# Patient Record
Sex: Male | Born: 1965 | Race: Black or African American | Hispanic: No | Marital: Single | Smoking: Never smoker
Health system: Southern US, Community
[De-identification: ages and names within clinical notes are randomized; demographics above are authoritative.]

## PROBLEM LIST (undated history)

## (undated) DIAGNOSIS — F32A Depression, unspecified: Secondary | ICD-10-CM

## (undated) DIAGNOSIS — E119 Type 2 diabetes mellitus without complications: Secondary | ICD-10-CM

## (undated) DIAGNOSIS — F319 Bipolar disorder, unspecified: Secondary | ICD-10-CM

## (undated) DIAGNOSIS — I10 Essential (primary) hypertension: Secondary | ICD-10-CM

## (undated) DIAGNOSIS — M549 Dorsalgia, unspecified: Secondary | ICD-10-CM

## (undated) DIAGNOSIS — M1990 Unspecified osteoarthritis, unspecified site: Secondary | ICD-10-CM

## (undated) DIAGNOSIS — K649 Unspecified hemorrhoids: Secondary | ICD-10-CM

## (undated) DIAGNOSIS — F329 Major depressive disorder, single episode, unspecified: Secondary | ICD-10-CM

## (undated) DIAGNOSIS — L100 Pemphigus vulgaris: Secondary | ICD-10-CM

## (undated) HISTORY — DX: Unspecified osteoarthritis, unspecified site: M19.90

## (undated) HISTORY — DX: Unspecified hemorrhoids: K64.9

---

## 2009-08-14 ENCOUNTER — Encounter: Admission: RE | Admit: 2009-08-14 | Discharge: 2009-08-14 | Payer: Self-pay | Admitting: Occupational Medicine

## 2009-08-14 IMAGING — CR DG CHEST 1V
1 series · 1 of 1 positions shown · non-contrast
Comparison: None.

CLINICAL DATA: Pre-employment physical.

CHEST - 1 VIEW

[view not recorded]
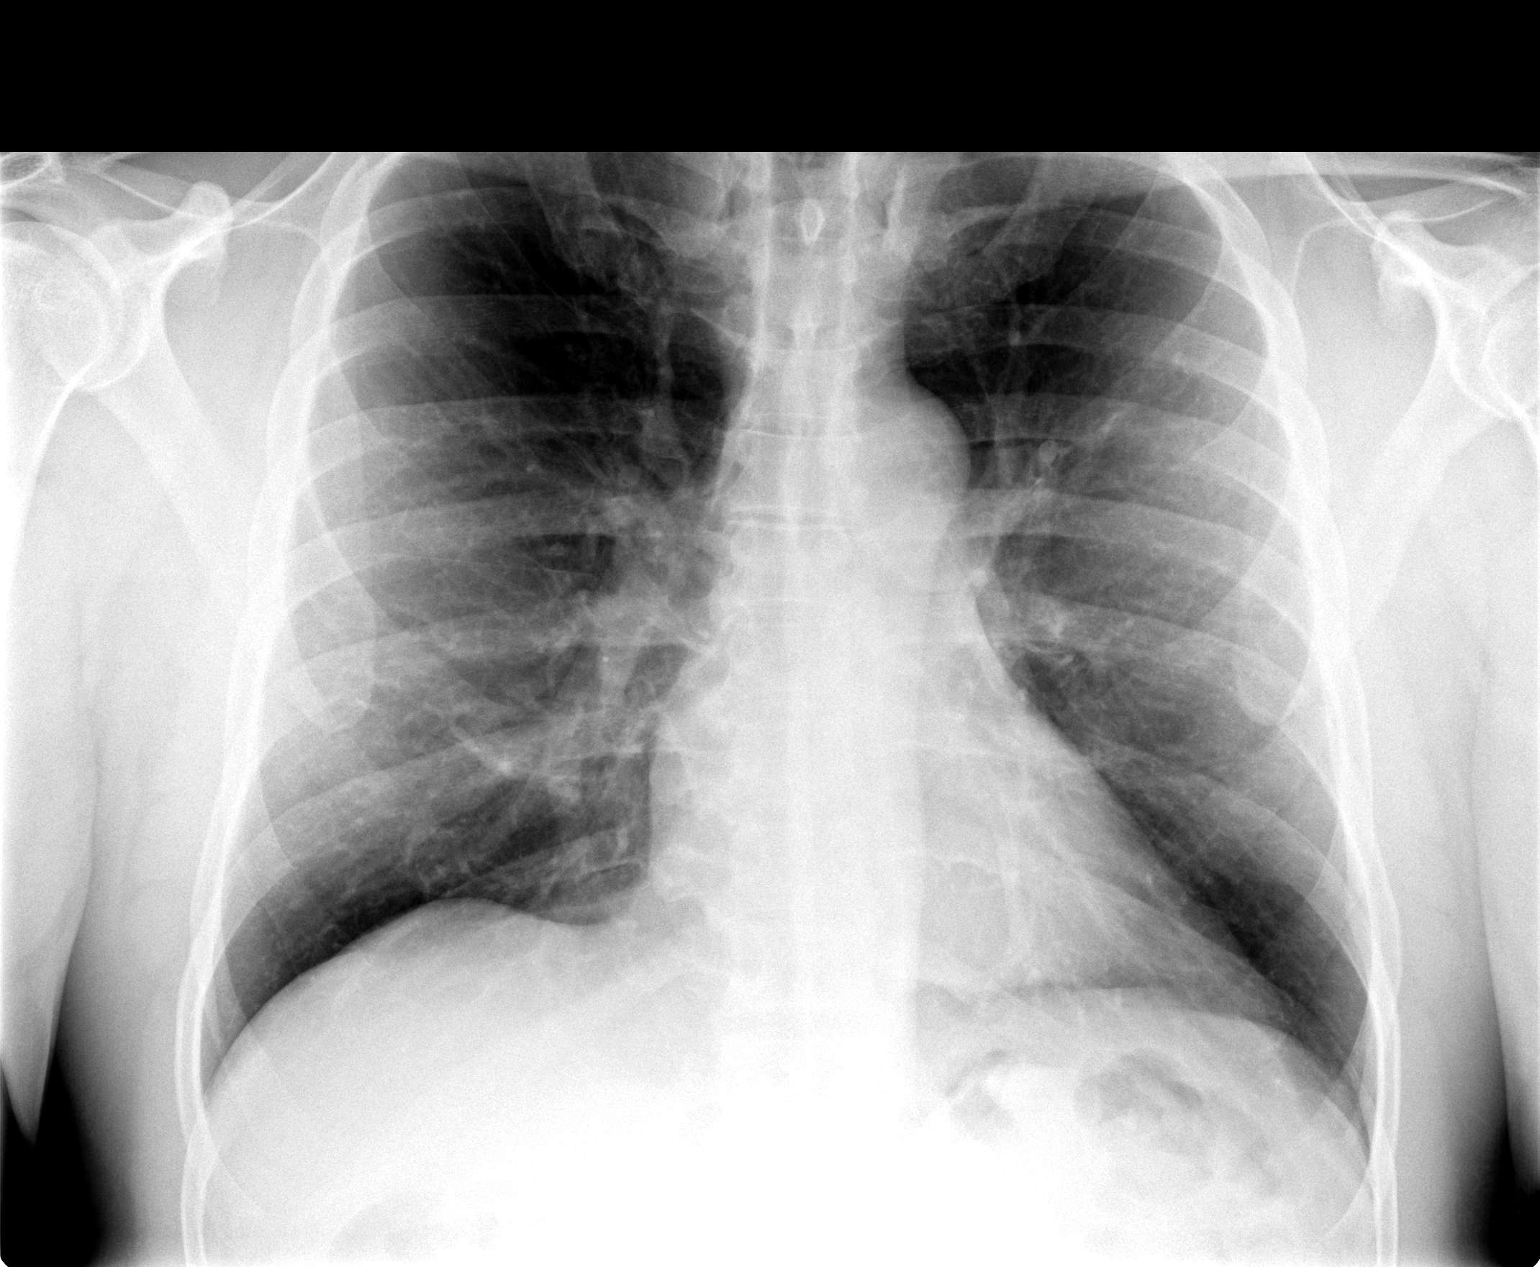

[1 of 1 positions shown; findings below may reference images not displayed]

FINDINGS: The cardiopericardial silhouette is normal in size.  The
lungs are clear.  The visualized soft tissues and bony thorax are
unremarkable.
IMPRESSION: Negative single view chest

## 2009-11-18 ENCOUNTER — Emergency Department (HOSPITAL_COMMUNITY): Admission: EM | Admit: 2009-11-18 | Discharge: 2009-11-18 | Payer: Self-pay | Admitting: Emergency Medicine

## 2010-03-07 ENCOUNTER — Emergency Department (HOSPITAL_COMMUNITY)
Admission: EM | Admit: 2010-03-07 | Discharge: 2010-03-07 | Disposition: A | Discharge status: Home / Self Care | Payer: Self-pay | Attending: Emergency Medicine | Admitting: Emergency Medicine

## 2010-03-07 DIAGNOSIS — J3489 Other specified disorders of nose and nasal sinuses: Secondary | ICD-10-CM | POA: Insufficient documentation

## 2010-03-07 DIAGNOSIS — I10 Essential (primary) hypertension: Secondary | ICD-10-CM | POA: Insufficient documentation

## 2010-03-07 DIAGNOSIS — L0291 Cutaneous abscess, unspecified: Secondary | ICD-10-CM | POA: Insufficient documentation

## 2010-03-07 DIAGNOSIS — B353 Tinea pedis: Secondary | ICD-10-CM | POA: Insufficient documentation

## 2010-03-07 DIAGNOSIS — L0390 Cellulitis, unspecified: Secondary | ICD-10-CM | POA: Insufficient documentation

## 2011-01-15 ENCOUNTER — Emergency Department (HOSPITAL_COMMUNITY)
Admission: EM | Admit: 2011-01-15 | Discharge: 2011-01-15 | Disposition: A | Discharge status: Home / Self Care | Payer: Self-pay | Attending: Emergency Medicine | Admitting: Emergency Medicine

## 2011-01-15 ENCOUNTER — Encounter: Payer: Self-pay | Admitting: Emergency Medicine

## 2011-01-15 DIAGNOSIS — R51 Headache: Secondary | ICD-10-CM | POA: Insufficient documentation

## 2011-01-15 DIAGNOSIS — R040 Epistaxis: Secondary | ICD-10-CM | POA: Insufficient documentation

## 2011-01-15 DIAGNOSIS — I10 Essential (primary) hypertension: Secondary | ICD-10-CM | POA: Insufficient documentation

## 2011-01-15 HISTORY — DX: Essential (primary) hypertension: I10

## 2011-01-15 NOTE — ED Notes (Signed)
Correction -pt not using saline as directed. Using afrin x 1 month

## 2011-01-15 NOTE — ED Provider Notes (Signed)
History     CSN: [id number] Arrival date & time: 01/15/2011 11:01 AM   First MD Initiated Contact with Patient 01/15/11 1104      Chief Complaint  Patient presents with  . Facial Pain    sinus preassure and headache x 2 months    (Consider location/radiation/quality/duration/timing/severity/associated sxs/prior treatment) HPI Comments: Patient presents emergency department for "dry sinuses".  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  Patient denies any fevers, night sweats, chills, rhinorrhea congestion, sinus pressure, headaches.  Patient has no other complaints.  The history is provided by the patient.    Past Medical History  Diagnosis Date  . Hypertension     History reviewed. No pertinent past surgical history.  History reviewed. No pertinent family history.  History  Substance Use Topics  . Smoking status: Never Smoker   . Smokeless tobacco: Not on file  . Alcohol Use: No      Review of Systems  Constitutional: Negative for fever, chills and fatigue.  HENT: Positive for sore throat and postnasal drip. Negative for ear pain, congestion, rhinorrhea, sneezing, trouble swallowing, neck pain, neck stiffness, sinus pressure and ear discharge.   Eyes: Negative for visual disturbance.  Respiratory: Negative for cough, chest tightness, shortness of breath and wheezing.   Cardiovascular: Negative for chest pain and palpitations.  Gastrointestinal: Negative for nausea, vomiting, diarrhea, constipation and blood in stool.  Skin: Negative for rash.  Neurological: Negative for dizziness, weakness, light-headedness, numbness and headaches.  All other systems reviewed and are negative.    Allergies  Lactose intolerance (gi) and Penicillins  Home Medications   Current Outpatient Rx  Name Route Sig Dispense Refill  . ASPIRIN EC 81  MG PO TBEC Oral Take 81 mg by mouth daily.      Marland Kitchen FLUTICASONE PROPIONATE 50 MCG/ACT NA SUSP Nasal Place 2 sprays into the nose 2 (two) times daily as needed.      Marland Kitchen LISINOPRIL 10 MG PO TABS Oral Take 10 mg by mouth daily.      Carma Leaven M PLUS PO TABS Oral Take 1 tablet by mouth daily.        BP 131/82  Pulse 80  Temp 98.1 F (36.7 C)  Resp 20  SpO2 100%  Physical Exam  Nursing note and vitals reviewed. Constitutional: He is oriented to person, place, and time. He appears well-developed and well-nourished. No distress.  HENT:  Head: Normocephalic and atraumatic. No trismus in the jaw.  Right Ear: Tympanic membrane, external ear and ear canal normal.  Left Ear: Tympanic membrane, external ear and ear canal normal.  Nose: Nose normal. No rhinorrhea. Right sinus exhibits no maxillary sinus tenderness and no frontal sinus tenderness. Left sinus exhibits no maxillary sinus tenderness and no frontal sinus tenderness.  Mouth/Throat: Uvula is midline and mucous membranes are normal. Normal dentition. No dental abscesses or uvula swelling. No oropharyngeal exudate, posterior oropharyngeal edema, posterior oropharyngeal erythema or tonsillar abscesses.  Neck: Trachea normal, normal range of motion and full passive range of motion without pain. Neck supple. No rigidity. Normal range of motion present. No Brudzinski's sign noted.  Cardiovascular: Normal rate and regular rhythm.   Pulmonary/Chest: Effort normal and breath sounds normal.  Abdominal: Soft.  Musculoskeletal: Normal range of motion.  Lymphadenopathy:       Head (right side): No preauricular and no posterior  auricular adenopathy present.       Head (left side): No preauricular and no posterior auricular adenopathy present.    He has no cervical adenopathy.  Neurological: He is alert and oriented to person, place, and time.  Skin: Skin is warm and dry. He is not diaphoretic.  Psychiatric: He has a normal mood and affect.    ED Course    Procedures (including critical care time)  Labs Reviewed - No data to display No results found.   No diagnosis found.  Instructed patient to flush with saline to hydrate his sinuses.  He is instructed to avoid the use of product slight Afrin that can be drying to the sinuses.  Patient has been referred to an ENT for further followup.  MDM  Epistaxis         Jaci Carrel, [place] 01/15/11 1155

## 2011-01-15 NOTE — ED Notes (Signed)
No complaints at present. Voices understanding of instructions given. Walked to check out window.  

## 2011-01-15 NOTE — ED Notes (Deleted)
Pt followed up with occupation health for c/o dry sinus. Using saline spray

## 2011-01-16 NOTE — ED Provider Notes (Signed)
Medical screening examination/treatment/procedure(s) were performed by non-physician practitioner and as supervising physician I was immediately available for consultation/collaboration.   Duanne Duchesne A Darrell Leonhardt, MD 01/16/11 1415 

## 2011-09-23 ENCOUNTER — Emergency Department (HOSPITAL_COMMUNITY)
Admission: EM | Admit: 2011-09-23 | Discharge: 2011-09-23 | Disposition: A | Discharge status: Home / Self Care | Payer: Self-pay | Attending: Emergency Medicine | Admitting: Emergency Medicine

## 2011-09-23 ENCOUNTER — Emergency Department (HOSPITAL_COMMUNITY): Payer: Self-pay

## 2011-09-23 ENCOUNTER — Encounter (HOSPITAL_COMMUNITY): Payer: Self-pay | Admitting: *Deleted

## 2011-09-23 DIAGNOSIS — I10 Essential (primary) hypertension: Secondary | ICD-10-CM | POA: Insufficient documentation

## 2011-09-23 DIAGNOSIS — J329 Chronic sinusitis, unspecified: Secondary | ICD-10-CM

## 2011-09-23 DIAGNOSIS — Z7982 Long term (current) use of aspirin: Secondary | ICD-10-CM | POA: Insufficient documentation

## 2011-09-23 DIAGNOSIS — R079 Chest pain, unspecified: Secondary | ICD-10-CM | POA: Insufficient documentation

## 2011-09-23 LAB — POCT I-STAT TROPONIN I: Troponin i, poc: 0 ng/mL (ref 0.00–0.08)

## 2011-09-23 LAB — BASIC METABOLIC PANEL
BUN: 18 mg/dL (ref 6–23)
GFR calc non Af Amer: 71 mL/min — ABNORMAL LOW (ref >90)
Glucose, Bld: 105 mg/dL — ABNORMAL HIGH (ref 70–99)
Potassium: 3.8 mEq/L (ref 3.5–5.1)
Sodium: 136 mEq/L (ref 135–145)

## 2011-09-23 LAB — CBC WITH DIFFERENTIAL/PLATELET
Basophils Absolute: 0 10*3/uL (ref 0.0–0.1)
Lymphocytes Relative: 30 % (ref 12–46)
Lymphs Abs: 1.2 10*3/uL (ref 0.7–4.0)
MCH: 27.9 pg (ref 26.0–34.0)
MCV: 82.8 fL (ref 78.0–100.0)
Neutro Abs: 2.4 10*3/uL (ref 1.7–7.7)
Neutrophils Relative %: 58 % (ref 43–77)
RBC: 5.12 MIL/uL (ref 4.22–5.81)
RDW: 14 % (ref 11.5–15.5)

## 2011-09-23 IMAGING — CR DG CHEST 2V
2 series · 2 of 2 positions shown · non-contrast
Comparison: Portable examination [DATE].

CLINICAL DATA: Chest pressure.  Hypertension.

CHEST - 2 VIEW

[w chest pa]
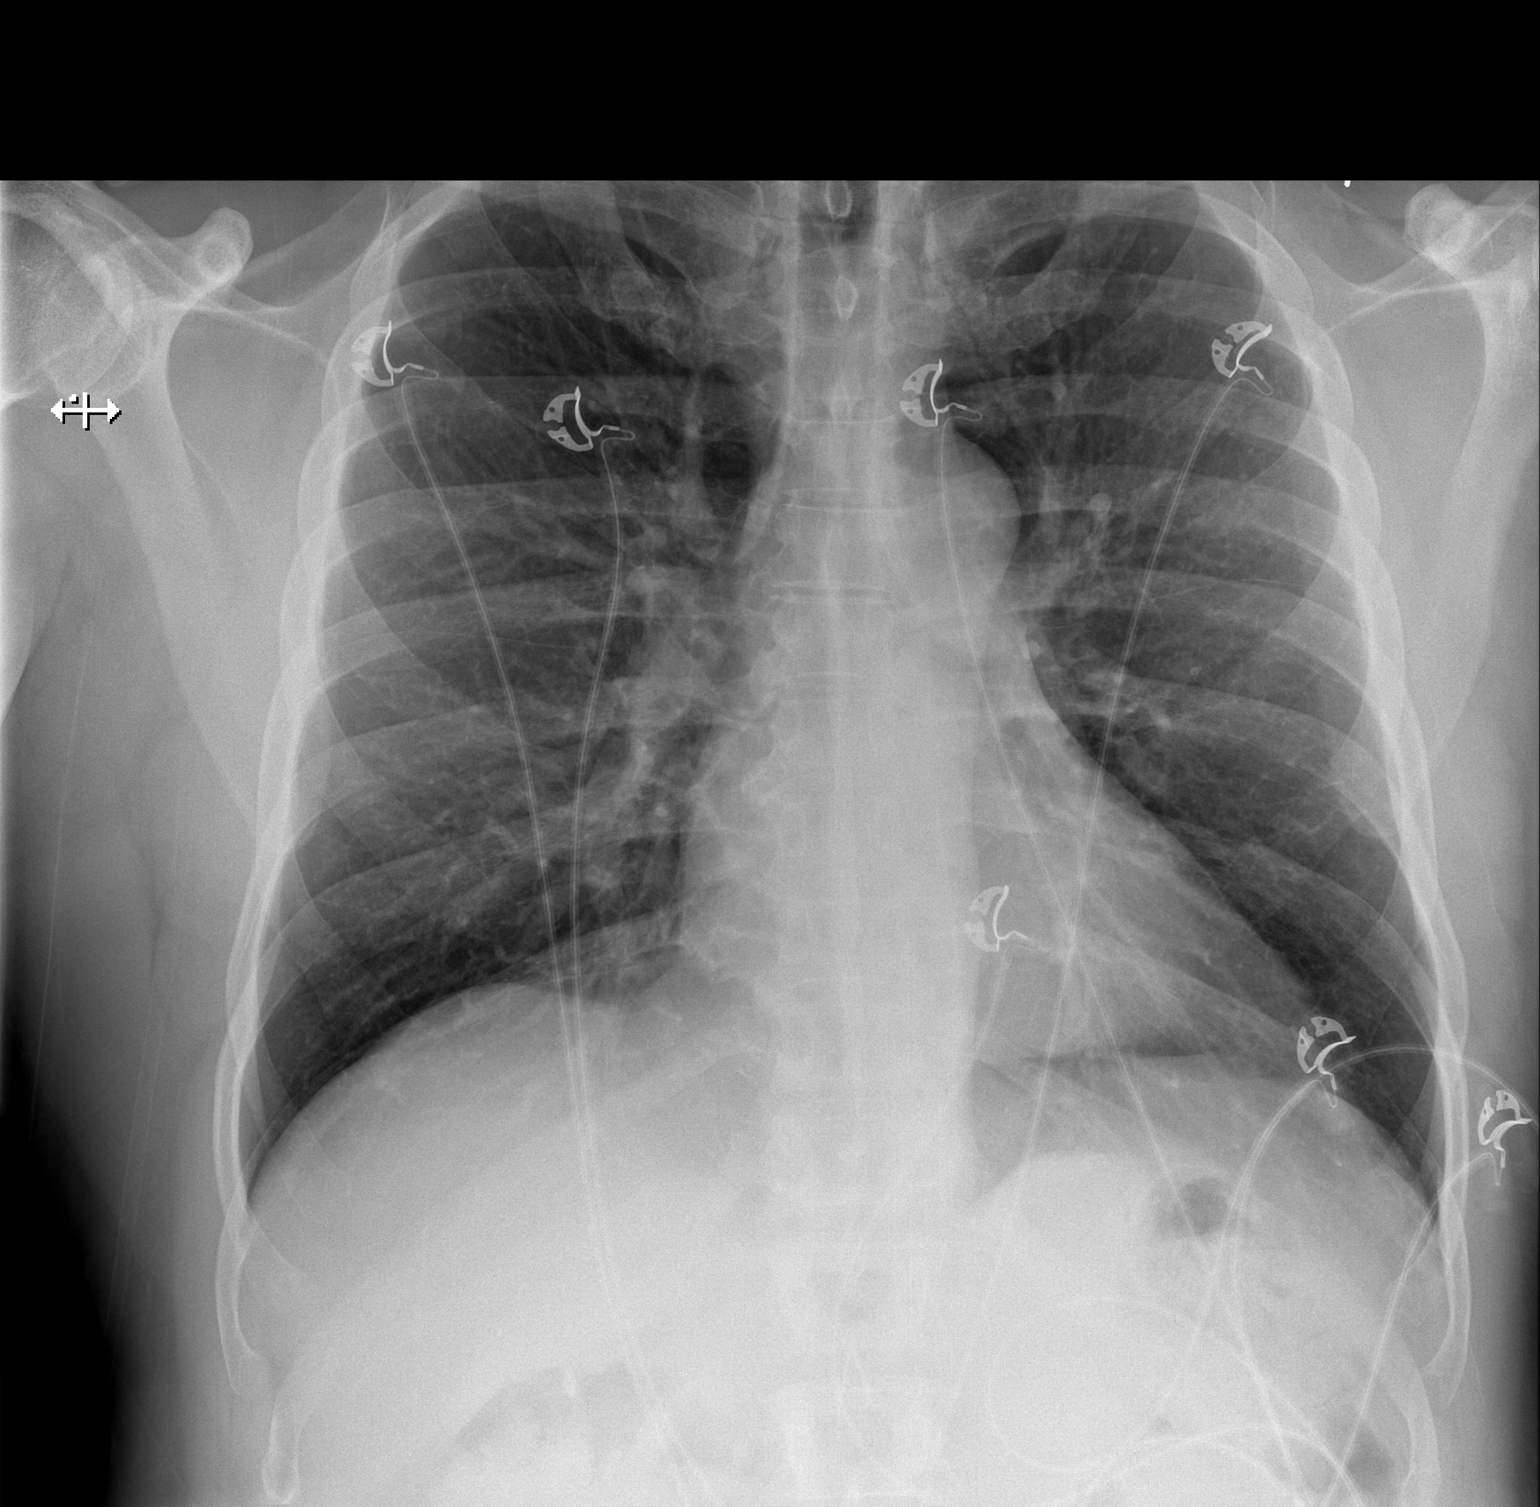

[w chest lat]
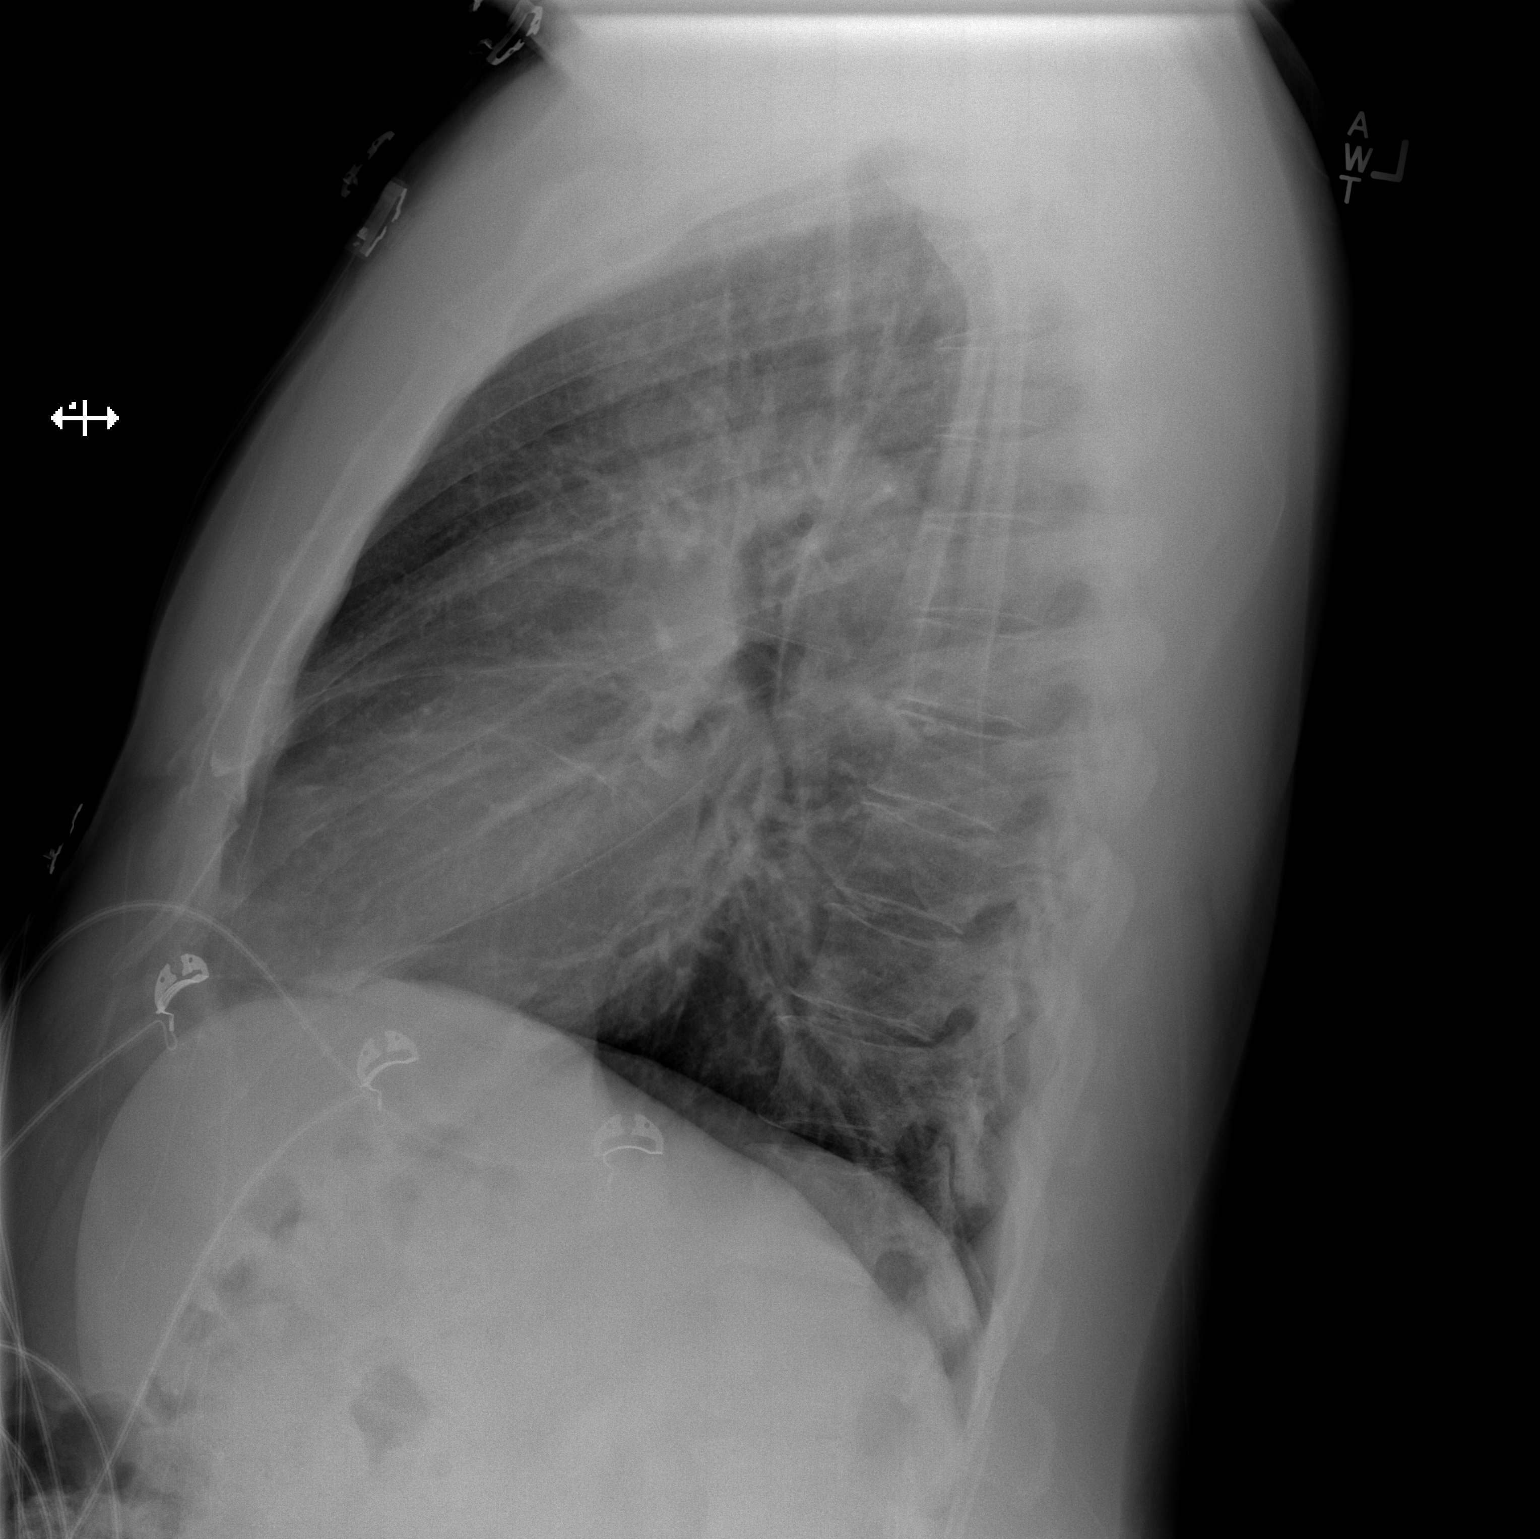

[2 of 2 positions shown; findings below may reference images not displayed]

FINDINGS: The heart size and mediastinal contours are stable.  The
lungs appear mildly hyperinflated with central airway thickening.
There is no airspace disease, edema or pleural effusion.  The
osseous structures appear stable.  Telemetry leads overlie the
chest.
IMPRESSION: Suspected mild chronic lung disease, stable.  No acute
cardiopulmonary process.

## 2011-09-23 MED ORDER — FLUTICASONE PROPIONATE 50 MCG/ACT NA SUSP: 2.0000 | Freq: Every day | NASAL | Status: DC

## 2011-09-23 NOTE — ED Notes (Signed)
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 Since last night, he has been feeling sinus pain/pressure and chest pressure. Also feels like throat is "tight" sts it started last night, went away and returned while he was at work today.

## 2011-09-23 NOTE — ED Provider Notes (Signed)
History     CSN: [id number]  Arrival date & time 09/23/11  1652   First MD Initiated Contact with Patient 09/23/11 1925      Chief Complaint  Patient presents with  . Chest Pain  . Sinus Pain     (Consider location/radiation/quality/duration/timing/severity/associated sxs/prior treatment) HPI Comments: Robert Hooper is a 46 y.o. Male who complains of a dry feeling in his chest, postnasal drip, and chest tightness. 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 He denies "chest pain," shortness of breath, cough, nausea, vomiting, weakness, or dizziness. He has previously had similar symptoms with sinusitis.  Patient is a 46 y.o. male presenting with chest pain. The history is provided by the patient.  Chest Pain     Past Medical History  Diagnosis Date  . Hypertension     History reviewed. No pertinent past surgical history.  No family history on file.  History  Substance Use Topics  . Smoking status: Never Smoker   . Smokeless tobacco: Not on file  . Alcohol Use: No      Review of Systems  Cardiovascular: Positive for chest pain.  All other systems reviewed and are negative.    Allergies  Lactose intolerance (gi) and Penicillins  Home Medications   Current Outpatient Rx  Name Route Sig Dispense Refill  . ASPIRIN EC 81 MG PO TBEC Oral Take 81 mg by mouth daily.      Marland Kitchen LISINOPRIL 10 MG PO TABS Oral Take 10 mg by mouth daily.      . ADULT MULTIVITAMIN W/MINERALS CH Oral Take 1 tablet by mouth daily.      There were no vitals taken for this visit.  Physical Exam  Nursing note and vitals reviewed. Constitutional: He is oriented to person, place, and time. He appears well-developed and well-nourished.  HENT:  Head: Normocephalic and atraumatic.  Right Ear: External ear normal.  Left Ear: External ear normal.       No percussive tenderness over sinuses  Eyes: Conjunctivae and EOM are normal. Pupils are equal, round, and  reactive to light.  Neck: Normal range of motion and phonation normal. Neck supple. No tracheal deviation present. No thyromegaly present.  Cardiovascular: Normal rate, regular rhythm, normal heart sounds and intact distal pulses.   Pulmonary/Chest: Effort normal and breath sounds normal. He exhibits no bony tenderness.  Abdominal: Soft. Normal appearance. There is no tenderness.  Musculoskeletal: Normal range of motion.  Neurological: He is alert and oriented to person, place, and time. He has normal strength. No cranial nerve deficit or sensory deficit. He exhibits normal muscle tone. Coordination normal.  Skin: Skin is warm, dry and intact.  Psychiatric: He has a normal mood and affect. His behavior is normal. Judgment and thought content normal.    ED Course  Procedures (including critical care time)    Date: 09/23/2011  Rate: 85  Rhythm: normal sinus rhythm  QRS Axis: normal  Intervals: normal  ST/T Wave abnormalities: Nonspecific T wave abnormality  Conduction Disutrbances:none  Narrative Interpretation:   Old EKG Reviewed: none available     Labs Reviewed  BASIC METABOLIC PANEL - Abnormal; Notable for the following:    Glucose, Bld 105 (*)     GFR calc non Af Amer 71 (*)     GFR calc Af Amer 83 (*)     All other components within normal limits  CBC WITH DIFFERENTIAL  POCT I-STAT TROPONIN I   Dg Chest 2 View  09/23/2011  *RADIOLOGY REPORT*  Clinical Data: Chest pressure.  Hypertension.  CHEST - 2 VIEW  Comparison: Portable examination 08/14/2009.  Findings: The heart size and mediastinal contours are stable.  The lungs appear mildly hyperinflated with central airway thickening. There is no airspace disease, edema or pleural effusion.  The osseous structures appear stable.  Telemetry leads overlie the chest.  IMPRESSION: Suspected mild chronic lung disease, stable.  No acute cardiopulmonary process.   Original Report Authenticated By: Gerrianne Scale, M.D.      No  diagnosis found.    MDM  Nonspecific symptoms, possibly allergic rhinitis versus environmental rhinitis with low suspicion for bacterial sinusitis. Doubt pneumonia, ACS, PE. Doubt metabolic instability, serious bacterial infection or impending vascular collapse; the patient is stable for discharge.    Plan: Home Medications- Flonase; Home Treatments- Neti Pot; Recommended follow up- PCP of choice prn   Flint Melter, MD 09/24/11 [phone]

## 2011-12-17 ENCOUNTER — Emergency Department (HOSPITAL_COMMUNITY)
Admission: EM | Admit: 2011-12-17 | Discharge: 2011-12-17 | Disposition: A | Discharge status: Home / Self Care | Payer: No Typology Code available for payment source | Attending: Emergency Medicine | Admitting: Emergency Medicine

## 2011-12-17 ENCOUNTER — Emergency Department (HOSPITAL_COMMUNITY): Payer: No Typology Code available for payment source

## 2011-12-17 ENCOUNTER — Encounter (HOSPITAL_COMMUNITY): Payer: Self-pay | Admitting: *Deleted

## 2011-12-17 DIAGNOSIS — S4980XA Other specified injuries of shoulder and upper arm, unspecified arm, initial encounter: Secondary | ICD-10-CM | POA: Insufficient documentation

## 2011-12-17 DIAGNOSIS — F319 Bipolar disorder, unspecified: Secondary | ICD-10-CM | POA: Insufficient documentation

## 2011-12-17 DIAGNOSIS — I10 Essential (primary) hypertension: Secondary | ICD-10-CM | POA: Insufficient documentation

## 2011-12-17 DIAGNOSIS — Y92410 Unspecified street and highway as the place of occurrence of the external cause: Secondary | ICD-10-CM | POA: Insufficient documentation

## 2011-12-17 DIAGNOSIS — M25512 Pain in left shoulder: Secondary | ICD-10-CM

## 2011-12-17 DIAGNOSIS — S0993XA Unspecified injury of face, initial encounter: Secondary | ICD-10-CM | POA: Insufficient documentation

## 2011-12-17 DIAGNOSIS — Y9389 Activity, other specified: Secondary | ICD-10-CM | POA: Insufficient documentation

## 2011-12-17 DIAGNOSIS — S46909A Unspecified injury of unspecified muscle, fascia and tendon at shoulder and upper arm level, unspecified arm, initial encounter: Secondary | ICD-10-CM | POA: Insufficient documentation

## 2011-12-17 DIAGNOSIS — F329 Major depressive disorder, single episode, unspecified: Secondary | ICD-10-CM | POA: Insufficient documentation

## 2011-12-17 DIAGNOSIS — IMO0002 Reserved for concepts with insufficient information to code with codable children: Secondary | ICD-10-CM | POA: Insufficient documentation

## 2011-12-17 DIAGNOSIS — M549 Dorsalgia, unspecified: Secondary | ICD-10-CM

## 2011-12-17 DIAGNOSIS — F3289 Other specified depressive episodes: Secondary | ICD-10-CM | POA: Insufficient documentation

## 2011-12-17 DIAGNOSIS — Z79899 Other long term (current) drug therapy: Secondary | ICD-10-CM | POA: Insufficient documentation

## 2011-12-17 DIAGNOSIS — Z7982 Long term (current) use of aspirin: Secondary | ICD-10-CM | POA: Insufficient documentation

## 2011-12-17 HISTORY — DX: Major depressive disorder, single episode, unspecified: F32.9

## 2011-12-17 HISTORY — DX: Depression, unspecified: F32.A

## 2011-12-17 HISTORY — DX: Bipolar disorder, unspecified: F31.9
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
## 2011-12-17 MED ORDER — DIAZEPAM 5 MG PO TABS: 5.0000 mg | ORAL_TABLET | Freq: Three times a day (TID) | ORAL | Status: DC | PRN

## 2011-12-17 MED ORDER — HYDROCODONE-ACETAMINOPHEN 5-325 MG PO TABS: 1.0000 | ORAL_TABLET | ORAL | Status: DC | PRN

## 2011-12-17 MED ORDER — IBUPROFEN 800 MG PO TABS: 800.0000 mg | ORAL_TABLET | Freq: Three times a day (TID) | ORAL | Status: DC | PRN

## 2011-12-17 MED ORDER — DIAZEPAM 5 MG PO TABS
5.0000 mg | ORAL_TABLET | Freq: Once | ORAL | Status: AC
  Administered 2011-12-17: 5 mg via ORAL
  Filled 2011-12-17: qty 1

## 2011-12-17 NOTE — ED Notes (Signed)
Pt verbalizes understanding 
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
## 2011-12-22 NOTE — ED Provider Notes (Signed)
Medical screening examination/treatment/procedure(s) were performed by non-physician practitioner and as supervising physician I was immediately available for consultation/collaboration.   Lyanne Co, MD 12/22/11 [phone]

## 2012-01-16 ENCOUNTER — Emergency Department (HOSPITAL_COMMUNITY)
Admission: EM | Admit: 2012-01-16 | Discharge: 2012-01-16 | Disposition: A | Discharge status: Home / Self Care | Payer: Medicaid Other | Attending: Emergency Medicine | Admitting: Emergency Medicine

## 2012-01-16 ENCOUNTER — Encounter (HOSPITAL_COMMUNITY): Payer: Self-pay | Admitting: *Deleted

## 2012-01-16 DIAGNOSIS — Z7982 Long term (current) use of aspirin: Secondary | ICD-10-CM | POA: Insufficient documentation

## 2012-01-16 DIAGNOSIS — F3289 Other specified depressive episodes: Secondary | ICD-10-CM | POA: Insufficient documentation

## 2012-01-16 DIAGNOSIS — I10 Essential (primary) hypertension: Secondary | ICD-10-CM | POA: Insufficient documentation

## 2012-01-16 DIAGNOSIS — R040 Epistaxis: Secondary | ICD-10-CM | POA: Insufficient documentation

## 2012-01-16 DIAGNOSIS — Z79899 Other long term (current) drug therapy: Secondary | ICD-10-CM | POA: Insufficient documentation

## 2012-01-16 DIAGNOSIS — F319 Bipolar disorder, unspecified: Secondary | ICD-10-CM | POA: Insufficient documentation

## 2012-01-16 DIAGNOSIS — F329 Major depressive disorder, single episode, unspecified: Secondary | ICD-10-CM | POA: Insufficient documentation

## 2012-01-16 DIAGNOSIS — R0982 Postnasal drip: Secondary | ICD-10-CM | POA: Insufficient documentation

## 2012-01-16 DIAGNOSIS — J329 Chronic sinusitis, unspecified: Secondary | ICD-10-CM | POA: Insufficient documentation

## 2012-01-16 DIAGNOSIS — J3489 Other specified disorders of nose and nasal sinuses: Secondary | ICD-10-CM | POA: Insufficient documentation

## 2012-01-16 MED ORDER — SALINE NASAL SPRAY 0.65 % NA SOLN: 1.0000 | NASAL | Status: DC | PRN

## 2012-01-16 MED ORDER — AMOXICILLIN-POT CLAVULANATE 875-125 MG PO TABS: 1.0000 | ORAL_TABLET | Freq: Two times a day (BID) | ORAL | Status: DC

## 2012-01-16 NOTE — ED Provider Notes (Signed)
History     CSN: [id number]  Arrival date & time 01/16/12  1735   First MD Initiated Contact with Patient 01/16/12 1757      Chief Complaint  Patient presents with  . Chest Pain    (Consider location/radiation/quality/duration/timing/severity/associated sxs/prior treatment) HPI  46 year old male with history of hypertension, and history of bipolar presents complaining of sinus pain.  Patient reports full 1-1/2 week he has had pain in his nostril and sinus. He described a dryness sensation in his nose, worsening with cold hot air, with nasal congestion, occasional scab formation and nosebleed, and pressure in his face. Symptom was gradual onset, waxing and waning, improves with using Afrin. He also endorsed strange to the back of his throat causing throat irritation. Otherwise patient denies fever, significant headache, neck stiffness, sneezing, productive cough, ear pain, chest pain, shortness of breath, or rash. No nausea no vomiting no diarrhea. He has had similar symptoms in August last one to 2 weeks and stop on its own.    Initial triage note indicates that patient has chest pain, however patient denies any chest pain. He has history of hypertension, taking lisinopril. Otherwise he is a nonsmoker, no history of diabetes, no significant family history of premature cardiac death.  Past Medical History  Diagnosis Date  . Hypertension   . Bipolar 1 disorder   . Depression     History reviewed. No pertinent past surgical history.  History reviewed. No pertinent family history.  History  Substance Use Topics  . Smoking status: Never Smoker   . Smokeless tobacco: Not on file  . Alcohol Use: No      Review of Systems  Constitutional: Negative for fever and chills.  HENT: Positive for nosebleeds, congestion, postnasal drip and sinus pressure. Negative for ear pain, sore throat, mouth sores, trouble swallowing, neck pain, neck stiffness and dental problem.   Respiratory:  Negative for shortness of breath.   Cardiovascular: Negative for chest pain.  Gastrointestinal: Negative for abdominal pain.  Skin: Negative for rash.  All other systems reviewed and are negative.    Allergies  Lactose intolerance (gi) and Penicillins  Home Medications   Current Outpatient Rx  Name  Route  Sig  Dispense  Refill  . ASPIRIN EC 81 MG PO TBEC   Oral   Take 81 mg by mouth daily.           . BUPROPION HCL ER (XL) 150 MG PO TB24   Oral   Take 150 mg by mouth daily.         Marland Kitchen DIAZEPAM 5 MG PO TABS   Oral   Take 1 tablet (5 mg total) by mouth every 8 (eight) hours as needed (muscle spasm or pain).   10 tablet   0   . HYDROCODONE-ACETAMINOPHEN 5-325 MG PO TABS   Oral   Take 1 tablet by mouth every 4 (four) hours as needed for pain.   12 tablet   0   . IBUPROFEN 800 MG PO TABS   Oral   Take 1 tablet (800 mg total) by mouth every 8 (eight) hours as needed for pain.   21 tablet   0   . LISINOPRIL 10 MG PO TABS   Oral   Take 10 mg by mouth daily.           Marland Kitchen LURASIDONE HCL 80 MG PO TABS   Oral   Take 80 mg by mouth daily.         Marland Kitchen  ADULT MULTIVITAMIN W/MINERALS CH   Oral   Take 1 tablet by mouth daily.           BP 138/83  Pulse 93  Temp 98.3 F (36.8 C) (Oral)  Resp 20  SpO2 97%  Physical Exam  Nursing note and vitals reviewed. Constitutional: He is oriented to person, place, and time. He appears well-developed and well-nourished. No distress.  HENT:  Head: Normocephalic and atraumatic.  Right Ear: External ear normal.  Mouth/Throat: Oropharynx is clear and moist. No oropharyngeal exudate.       Nose: small scabs noted along nasal sinus, mucosa dry.  No septal hematoma.  No significant sinus tenderness on palpation.      Eyes: Conjunctivae normal are normal.  Neck: Neck supple.  Cardiovascular: Normal rate and regular rhythm.  Exam reveals no gallop and no friction rub.   No murmur heard. Pulmonary/Chest: Effort normal and  breath sounds normal. No respiratory distress. He has no wheezes.  Abdominal: Soft. There is no tenderness.  Musculoskeletal: Normal range of motion. He exhibits no edema.  Lymphadenopathy:    He has no cervical adenopathy.  Neurological: He is alert and oriented to person, place, and time.  Skin: Skin is warm. No rash noted.    ED Course  Procedures (including critical care time)   Date: 01/16/2012  Rate: 89  Rhythm: sinus tachycardia  QRS Axis: normal  Intervals: normal  ST/T Wave abnormalities: normal  Conduction Disutrbances:none  Narrative Interpretation:   Old EKG Reviewed: none available   1. sinusitis  MDM  C/o sinus pain and nasal sensitivity/dryness over a week.  He is afebrile, vss.  Pt has no CP/SOB and low risk for cardiac disease.  Due to duration of sxs, will recommend abx, augmentin for sinusitis.  Pt sts he was told that he has PCN allergy but does not remember which.  I discusses risk of allergic rxn to be mindful of.  Recommend dehumidifier in room and to apply vapor rub in nostril for comfort.  Told to avoid long use of afrin.  Pt voice understanding and agrees with plan.     BP 138/83  Pulse 93  Temp 98.3 F (36.8 C) (Oral)  Resp 20  SpO2 97%  I have reviewed nursing notes and vital signs.   I reviewed available ER/hospitalization records thought the EMR     Fayrene Helper, [place] 01/16/12 1845

## 2012-01-16 NOTE — ED Provider Notes (Signed)
Medical screening examination/treatment/procedure(s) were performed by non-physician practitioner and as supervising physician I was immediately available for consultation/collaboration.    Celene Kras, MD 01/16/12 [phone]

## 2012-01-16 NOTE — ED Notes (Signed)
Pt reports tightness in throat and in chest, reports "something just don't feel right in my chest." also c/o dryness in nose. Reports tightness in face/head.
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
## 2012-09-13 ENCOUNTER — Ambulatory Visit: Payer: Medicaid Other | Attending: Anesthesiology | Admitting: Physical Therapy

## 2012-09-13 DIAGNOSIS — R262 Difficulty in walking, not elsewhere classified: Secondary | ICD-10-CM | POA: Insufficient documentation

## 2012-09-13 DIAGNOSIS — R5381 Other malaise: Secondary | ICD-10-CM | POA: Insufficient documentation

## 2012-09-13 DIAGNOSIS — IMO0001 Reserved for inherently not codable concepts without codable children: Secondary | ICD-10-CM | POA: Insufficient documentation

## 2012-09-13 DIAGNOSIS — M545 Low back pain, unspecified: Secondary | ICD-10-CM | POA: Insufficient documentation

## 2012-09-14 ENCOUNTER — Encounter (HOSPITAL_COMMUNITY): Payer: Self-pay | Admitting: Emergency Medicine

## 2012-09-14 ENCOUNTER — Emergency Department (HOSPITAL_COMMUNITY)
Admission: EM | Admit: 2012-09-14 | Discharge: 2012-09-14 | Disposition: A | Discharge status: Home / Self Care | Payer: Medicaid Other | Attending: Emergency Medicine | Admitting: Emergency Medicine

## 2012-09-14 DIAGNOSIS — K5900 Constipation, unspecified: Secondary | ICD-10-CM | POA: Insufficient documentation

## 2012-09-14 DIAGNOSIS — K648 Other hemorrhoids: Secondary | ICD-10-CM | POA: Insufficient documentation

## 2012-09-14 DIAGNOSIS — F319 Bipolar disorder, unspecified: Secondary | ICD-10-CM | POA: Insufficient documentation

## 2012-09-14 DIAGNOSIS — K625 Hemorrhage of anus and rectum: Secondary | ICD-10-CM

## 2012-09-14 DIAGNOSIS — IMO0002 Reserved for concepts with insufficient information to code with codable children: Secondary | ICD-10-CM | POA: Insufficient documentation

## 2012-09-14 DIAGNOSIS — Z79899 Other long term (current) drug therapy: Secondary | ICD-10-CM | POA: Insufficient documentation

## 2012-09-14 DIAGNOSIS — Z880 Allergy status to penicillin: Secondary | ICD-10-CM | POA: Insufficient documentation

## 2012-09-14 DIAGNOSIS — I10 Essential (primary) hypertension: Secondary | ICD-10-CM | POA: Insufficient documentation

## 2012-09-14 LAB — URINALYSIS, ROUTINE W REFLEX MICROSCOPIC
Bilirubin Urine: NEGATIVE
Glucose, UA: >1000 mg/dL — AB
Ketones, ur: 15 mg/dL — AB
Nitrite: NEGATIVE
Protein, ur: NEGATIVE mg/dL
Specific Gravity, Urine: 1.042 — ABNORMAL HIGH (ref 1.005–1.030)

## 2012-09-14 LAB — URINE MICROSCOPIC-ADD ON: Urine-Other: NONE SEEN

## 2012-09-14 MED ORDER — HYDROCORTISONE ACETATE 25 MG RE SUPP: 25.0000 mg | Freq: Two times a day (BID) | RECTAL | Status: DC

## 2012-09-14 NOTE — ED Provider Notes (Signed)
CSN: [id number]     Arrival date & time 09/14/12  1828 History     First MD Initiated Contact with Patient 09/14/12 1906     Chief Complaint  Patient presents with  . Rectal Pain   (Consider location/radiation/quality/duration/timing/severity/associated sxs/prior Treatment) The history is provided by the patient and medical records. No language interpreter was used.    Robert Hooper is a 47 y.o. male  with a hx of HTN, bipolar disorder and depression presents to the Emergency Department complaining of gradual, intermittent, progressively worsening rectal pain beginning 2 days ago with associated constipation and a small amount of bright red blood. Patient reports he has a history of hemorrhoids but has no problem with them for some time. He also reports constipation for the last several weeks. He states that when he is a bowel movement comes out and very small pellets and he must strain.  He also reports that during his bowel movements he feels as if the feces is scraping the sides of his rectum.   He reports brown stool on the toilet with some bright red blood on the outside. He also reports small amounts of bright red blood on toilet paper. The blood has never filled the toilet bowl.   The symptoms occur intermittently and he reports only a handful of episodes.  He's tried over-the-counter medications without relief.  Pt denies fever, chills, headache, neck pain, chest pain, shortness of breath, abdominal pain, nausea, vomiting, diarrhea and weakness, dizziness, syncope, dysuria, hematuria.  Patient specifically denies melena, maroon colored stool or bleeding and coffee-ground feces. Patient has no history of bleeding disorder. He does not take anticoagulants or aspirin.  He has no history of anemia.   Past Medical History  Diagnosis Date  . Hypertension   . Bipolar 1 disorder   . Depression    History reviewed. No pertinent past surgical history. No family history on file. History   Substance Use Topics  . Smoking status: Never Smoker   . Smokeless tobacco: Not on file  . Alcohol Use: No    Review of Systems  Constitutional: Negative for fever, diaphoresis, appetite change, fatigue and unexpected weight change.  HENT: Negative for mouth sores and neck stiffness.   Eyes: Negative for visual disturbance.  Respiratory: Negative for cough, chest tightness, shortness of breath and wheezing.   Cardiovascular: Negative for chest pain.  Gastrointestinal: Positive for anal bleeding. Negative for nausea, vomiting, abdominal pain, diarrhea, constipation and blood in stool.  Endocrine: Negative for polydipsia, polyphagia and polyuria.  Genitourinary: Negative for dysuria, urgency, frequency and hematuria.  Musculoskeletal: Negative for back pain.  Skin: Negative for rash.  Allergic/Immunologic: Negative for immunocompromised state.  Neurological: Negative for syncope, light-headedness and headaches.  Hematological: Does not bruise/bleed easily.  Psychiatric/Behavioral: Negative for sleep disturbance. The patient is not nervous/anxious.     Allergies  Lactose intolerance (gi) and Penicillins  Home Medications   Current Outpatient Rx  Name  Route  Sig  Dispense  Refill  . buPROPion (WELLBUTRIN XL) 150 MG 24 hr tablet   Oral   Take 150 mg by mouth daily.         Marland Kitchen lisinopril (PRINIVIL,ZESTRIL) 10 MG tablet   Oral   Take 10 mg by mouth daily.           . Multiple Vitamin (MULTIVITAMIN WITH MINERALS) TABS   Oral   Take 1 tablet by mouth daily.         . hydrocortisone (ANUSOL-HC) 25 MG  suppository   Rectal   Place 1 suppository (25 mg total) rectally 2 (two) times daily. For 7 days   14 suppository   0    BP 127/75  Pulse 99  Temp(Src) 98.7 F (37.1 C) (Oral)  Resp 16  SpO2 99% Physical Exam  Nursing note and vitals reviewed. Constitutional: He is oriented to person, place, and time. He appears well-developed and well-nourished. No distress.   Awake, alert, nontoxic appearance  HENT:  Head: Normocephalic and atraumatic.  Mouth/Throat: Oropharynx is clear and moist. No oropharyngeal exudate.  No pallor of the mucous membranes  Eyes: Conjunctivae and EOM are normal. Pupils are equal, round, and reactive to light. No scleral icterus.  Neck: Normal range of motion. Neck supple.  Cardiovascular: Normal rate, regular rhythm, normal heart sounds and intact distal pulses.   No murmur heard. Pulmonary/Chest: Effort normal and breath sounds normal. No respiratory distress. He has no wheezes. He has no rales. He exhibits no tenderness.  Abdominal: Soft. Bowel sounds are normal. He exhibits no distension and no mass. There is no tenderness. There is no rebound and no guarding.  Genitourinary: Prostate normal. Rectal exam shows internal hemorrhoid and tenderness. Rectal exam shows no external hemorrhoid, no fissure, no mass and anal tone normal. Guaiac positive stool. Prostate is not enlarged and not tender.  Patient with palpable internal hemorrhoids No melena on DRE No tender or boggy prostate  Musculoskeletal: Normal range of motion. He exhibits no edema.  Lymphadenopathy:    He has no cervical adenopathy.  Neurological: He is alert and oriented to person, place, and time. He exhibits normal muscle tone. Coordination normal.  Speech is clear and goal oriented Moves extremities without ataxia  Skin: Skin is warm and dry. No rash noted. He is not diaphoretic. No erythema. No pallor.  Psychiatric: He has a normal mood and affect. His behavior is normal.    ED Course   Procedures (including critical care time)  Labs Reviewed  URINALYSIS, ROUTINE W REFLEX MICROSCOPIC - Abnormal; Notable for the following:    Specific Gravity, Urine 1.042 (*)    Glucose, UA >1000 (*)    Ketones, ur 15 (*)    All other components within normal limits  URINE MICROSCOPIC-ADD ON   No results found. 1. Internal hemorrhoids   2. Bright red blood per  rectum     MDM  Robert Hooper presents with BRBPR intermittently for 1 week always assocaited with straining for a bowel movement and pain with defecation.  This with a history of internal hemorrhoids. He states symptoms today feel the same. Palpable internal hemorrhoids on exam. Fecal occult positive. UA without evidence of urinary tract infection and no tender or boggy prostate on exam. No concern for prostatitis.  Recommend increase in water intake, dietary fiber and use of MiraLax. Will prescribe Anusol for comfort. Also recommended followup with daycare and [facility] [place] [facility] for discussion of banding of hemorrhoids.  Patient alert, oriented, nontoxic, nonseptic appearing, vital signs stable.  Patient without tachycardia, shortness of breath, fatigue or pallor of the mucous membranes to suggest severe anemia. I have also discussed reasons to return immediately to the ER.  Patient expresses understanding and agrees with plan.    Dahlia Client Kazuto Sevey, PA-C 09/15/12 0145

## 2012-09-14 NOTE — ED Notes (Signed)
Patient is alert and oriented x3.  He was given DC instructions and follow up visit instructions.  Patient gave verbal understanding.  He was DC ambulatory under his own power to home.  V/S stable.  He was not showing any signs of distress on DC 

## 2012-09-14 NOTE — ED Notes (Signed)
Pt c/o rectal pain and bleeding x2 days.  Reports while having a bowel movement "it feels like little rocks, or sand paper'".  Reports seeing a small amount of bright red blood.

## 2012-09-17 NOTE — ED Provider Notes (Signed)
Medical screening examination/treatment/procedure(s) were performed by non-physician practitioner and as supervising physician I was immediately available for consultation/collaboration.   Dalyla Chui L Niasia Lanphear, MD 09/17/12 0135 

## 2012-09-20 ENCOUNTER — Encounter (INDEPENDENT_AMBULATORY_CARE_PROVIDER_SITE_OTHER): Payer: Self-pay | Admitting: General Surgery

## 2012-09-20 ENCOUNTER — Ambulatory Visit (INDEPENDENT_AMBULATORY_CARE_PROVIDER_SITE_OTHER): Payer: Medicaid Other | Admitting: General Surgery

## 2012-09-20 VITALS — BP 132/78 | HR 104 | Resp 16 | Ht 68.0 in | Wt 205.0 lb

## 2012-09-20 DIAGNOSIS — K648 Other hemorrhoids: Secondary | ICD-10-CM

## 2012-09-20 NOTE — Progress Notes (Signed)
Patient ID: Robert Hooper, male   DOB: 1965-03-22, 47 y.o.   MRN: [id number]  Chief Complaint  Patient presents with  . New Evaluation    eval internal hems    HPI Robert Hooper is a 47 y.o. male.  We are asked to see the patient in consultation by Dr. Roderic Palau to evaluate him for bleeding internal hemorrhoids. The patient is a 47 year old black male who had an episode of blood with bowel movement about 2 weeks ago. He denied any pain with bowel movements he has had some intermittent constipation. He went to see his medical doctor who gave him some aerobic cream to use and the bleeding has now stopped. His bowels do move pretty regularly he recently had a meat and is eating mostly vegetables. He also states that he has a lot of stomach upset HPI  Past Medical History  Diagnosis Date  . Hypertension   . Bipolar 1 disorder   . Depression   . Arthritis     History reviewed. No pertinent past surgical history.  History reviewed. No pertinent family history.  Social History History  Substance Use Topics  . Smoking status: Never Smoker   . Smokeless tobacco: Never Used  . Alcohol Use: No    Allergies  Allergen Reactions  . Lactose Intolerance (Gi)   . Penicillins Other (See Comments)    Childhood reaction    Current Outpatient Prescriptions  Medication Sig Dispense Refill  . hydrocortisone (ANUSOL-HC) 25 MG suppository Place 1 suppository (25 mg total) rectally 2 (two) times daily. For 7 days  14 suppository  0  . lisinopril (PRINIVIL,ZESTRIL) 10 MG tablet Take 10 mg by mouth daily.        . Multiple Vitamin (MULTIVITAMIN WITH MINERALS) TABS Take 1 tablet by mouth daily.       No current facility-administered medications for this visit.    Review of Systems Review of Systems  Constitutional: Negative.   HENT: Negative.   Eyes: Negative.   Respiratory: Negative.   Cardiovascular: Negative.   Gastrointestinal: Positive for blood in stool. Negative for rectal pain.   Endocrine: Negative.   Genitourinary: Negative.   Musculoskeletal: Negative.   Skin: Negative.   Allergic/Immunologic: Negative.   Neurological: Negative.   Hematological: Negative.   Psychiatric/Behavioral: Negative.     Blood pressure 132/78, pulse 104, resp. rate 16, height 5\' 8"  (1.727 m), weight 205 lb (92.987 kg).  Physical Exam Physical Exam  Constitutional: He is oriented to person, place, and time. He appears well-developed and well-nourished.  HENT:  Head: Normocephalic and atraumatic.  Eyes: Conjunctivae and EOM are normal. Pupils are equal, round, and reactive to light.  Neck: Normal range of motion. Neck supple.  Cardiovascular: Normal rate, regular rhythm and normal heart sounds.   Pulmonary/Chest: Effort normal and breath sounds normal.  Abdominal: Soft. Bowel sounds are normal.  Genitourinary: Rectum normal.  There is no real external hemorrhoidal disease. On digital exam there is good rectal tone. On anoscopic exam he has some mild to moderate internal hemorrhoidal disease but no bleeding today  Musculoskeletal: Normal range of motion.  Neurological: He is alert and oriented to person, place, and time.  Skin: Skin is warm and dry.  Psychiatric: He has a normal mood and affect. His behavior is normal.    Data Reviewed As above  Assessment    The patient has some mild to moderate internal hemorrhoids with a recent episode of bleeding that has now resolved. Because of the blood in  the stool would recommend that he see a gastroenterologist as well to see if there is any need for a colonoscopy. His bleeding has resolved and he has no rectal pain. At this point his options include continued diet modification and avoiding periods of constipation Versus an internal hemorrhoid pain banding or injection.At this point he would favor diet modification. He agrees to call me if the bleeding happens again.    Plan    Plan for continued diet modification and avoidance of  constipation as well as a referral to gastroenterology        TOTH III,PAUL S 09/20/2012, 10:40 AM

## 2012-09-20 NOTE — Patient Instructions (Signed)
Will refer to GI Call if bleeding recurs

## 2012-09-21 ENCOUNTER — Telehealth (INDEPENDENT_AMBULATORY_CARE_PROVIDER_SITE_OTHER): Payer: Self-pay | Admitting: General Surgery

## 2012-09-21 ENCOUNTER — Encounter: Payer: Self-pay | Admitting: Gastroenterology

## 2012-09-21 NOTE — Telephone Encounter (Signed)
LMOM letting pt know he has an appt w/ [facility] w/ Dr. Jarold Motto on 10/06/12 at 9:00 w/ an arrival time of 8:45.  Address and phone number were given.

## 2012-09-29 ENCOUNTER — Encounter: Payer: Self-pay | Admitting: *Deleted

## 2012-10-06 ENCOUNTER — Ambulatory Visit: Payer: Medicaid Other | Admitting: Gastroenterology

## 2012-10-17 ENCOUNTER — Emergency Department (HOSPITAL_COMMUNITY): Payer: No Typology Code available for payment source

## 2012-10-17 ENCOUNTER — Emergency Department (HOSPITAL_COMMUNITY)
Admission: EM | Admit: 2012-10-17 | Discharge: 2012-10-17 | Disposition: A | Discharge status: Home / Self Care | Payer: No Typology Code available for payment source | Attending: Emergency Medicine | Admitting: Emergency Medicine

## 2012-10-17 DIAGNOSIS — Z8739 Personal history of other diseases of the musculoskeletal system and connective tissue: Secondary | ICD-10-CM | POA: Insufficient documentation

## 2012-10-17 DIAGNOSIS — Z880 Allergy status to penicillin: Secondary | ICD-10-CM | POA: Insufficient documentation

## 2012-10-17 DIAGNOSIS — Z8659 Personal history of other mental and behavioral disorders: Secondary | ICD-10-CM | POA: Insufficient documentation

## 2012-10-17 DIAGNOSIS — S161XXA Strain of muscle, fascia and tendon at neck level, initial encounter: Secondary | ICD-10-CM

## 2012-10-17 DIAGNOSIS — S39012A Strain of muscle, fascia and tendon of lower back, initial encounter: Secondary | ICD-10-CM

## 2012-10-17 DIAGNOSIS — S139XXA Sprain of joints and ligaments of unspecified parts of neck, initial encounter: Secondary | ICD-10-CM | POA: Insufficient documentation

## 2012-10-17 DIAGNOSIS — S335XXA Sprain of ligaments of lumbar spine, initial encounter: Secondary | ICD-10-CM | POA: Insufficient documentation

## 2012-10-17 DIAGNOSIS — Y92410 Unspecified street and highway as the place of occurrence of the external cause: Secondary | ICD-10-CM | POA: Insufficient documentation

## 2012-10-17 DIAGNOSIS — Y939 Activity, unspecified: Secondary | ICD-10-CM | POA: Insufficient documentation

## 2012-10-17 DIAGNOSIS — I10 Essential (primary) hypertension: Secondary | ICD-10-CM | POA: Insufficient documentation
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
## 2012-10-17 MED ORDER — MORPHINE SULFATE 4 MG/ML IJ SOLN
4.0000 mg | Freq: Once | INTRAMUSCULAR | Status: DC
  Filled 2012-10-17 (×2): qty 1

## 2012-10-17 MED ORDER — HYDROCODONE-ACETAMINOPHEN 5-325 MG PO TABS: 1.0000 | ORAL_TABLET | Freq: Four times a day (QID) | ORAL | Status: DC | PRN

## 2012-10-17 MED ORDER — OXYCODONE-ACETAMINOPHEN 5-325 MG PO TABS
1.0000 | ORAL_TABLET | Freq: Once | ORAL | Status: AC
  Administered 2012-10-17: 1 via ORAL
  Filled 2012-10-17: qty 1

## 2012-10-17 MED ORDER — IBUPROFEN 800 MG PO TABS: 800.0000 mg | ORAL_TABLET | Freq: Three times a day (TID) | ORAL | Status: DC | PRN

## 2012-10-17 MED ORDER — OXYCODONE-ACETAMINOPHEN 5-325 MG PO TABS: 1.0000 | ORAL_TABLET | Freq: Once | ORAL | Status: DC
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
## 2012-10-18 ENCOUNTER — Encounter (HOSPITAL_COMMUNITY): Payer: Self-pay | Admitting: *Deleted

## 2012-10-18 ENCOUNTER — Emergency Department (HOSPITAL_COMMUNITY): Payer: No Typology Code available for payment source

## 2012-10-18 ENCOUNTER — Emergency Department (HOSPITAL_COMMUNITY)
Admission: EM | Admit: 2012-10-18 | Discharge: 2012-10-18 | Disposition: A | Discharge status: Home / Self Care | Payer: No Typology Code available for payment source | Attending: Emergency Medicine | Admitting: Emergency Medicine

## 2012-10-18 DIAGNOSIS — Z880 Allergy status to penicillin: Secondary | ICD-10-CM | POA: Insufficient documentation

## 2012-10-18 DIAGNOSIS — R42 Dizziness and giddiness: Secondary | ICD-10-CM | POA: Insufficient documentation

## 2012-10-18 DIAGNOSIS — G8911 Acute pain due to trauma: Secondary | ICD-10-CM | POA: Insufficient documentation

## 2012-10-18 DIAGNOSIS — Z8659 Personal history of other mental and behavioral disorders: Secondary | ICD-10-CM | POA: Insufficient documentation

## 2012-10-18 DIAGNOSIS — R112 Nausea with vomiting, unspecified: Secondary | ICD-10-CM | POA: Insufficient documentation

## 2012-10-18 DIAGNOSIS — R51 Headache: Secondary | ICD-10-CM | POA: Insufficient documentation

## 2012-10-18 DIAGNOSIS — Z79899 Other long term (current) drug therapy: Secondary | ICD-10-CM | POA: Insufficient documentation

## 2012-10-18 DIAGNOSIS — Z8739 Personal history of other diseases of the musculoskeletal system and connective tissue: Secondary | ICD-10-CM | POA: Insufficient documentation

## 2012-10-18 DIAGNOSIS — H53149 Visual discomfort, unspecified: Secondary | ICD-10-CM | POA: Insufficient documentation

## 2012-10-18 DIAGNOSIS — I10 Essential (primary) hypertension: Secondary | ICD-10-CM | POA: Insufficient documentation

## 2012-10-18 DIAGNOSIS — H5710 Ocular pain, unspecified eye: Secondary | ICD-10-CM | POA: Insufficient documentation

## 2012-10-18 LAB — GLUCOSE, CAPILLARY: Glucose-Capillary: 89 mg/dL (ref 70–99)
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
## 2012-10-18 MED ORDER — KETOROLAC TROMETHAMINE 60 MG/2ML IM SOLN
60.0000 mg | Freq: Once | INTRAMUSCULAR | Status: AC
  Administered 2012-10-18: 60 mg via INTRAMUSCULAR
  Filled 2012-10-18: qty 2

## 2012-10-18 MED ORDER — DIPHENHYDRAMINE HCL 50 MG/ML IJ SOLN
50.0000 mg | Freq: Once | INTRAMUSCULAR | Status: AC
  Administered 2012-10-18: 50 mg via INTRAMUSCULAR
  Filled 2012-10-18: qty 1

## 2012-10-18 MED ORDER — ONDANSETRON HCL 4 MG PO TABS: 4.0000 mg | ORAL_TABLET | Freq: Four times a day (QID) | ORAL | Status: DC

## 2012-10-18 MED ORDER — METOCLOPRAMIDE HCL 5 MG/ML IJ SOLN
10.0000 mg | Freq: Once | INTRAMUSCULAR | Status: AC
  Administered 2012-10-18: 10 mg via INTRAMUSCULAR
  Filled 2012-10-18: qty 2

## 2012-10-18 NOTE — ED Notes (Signed)
Pt refused to ambulate in the hall states he feel much better
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
## 2012-10-18 NOTE — ED Notes (Signed)
Pt states that he felt dizzy the whole time standing up during orthostatic vitals signs

## 2012-10-18 NOTE — ED Provider Notes (Signed)
Medical screening examination/treatment/procedure(s) were performed by non-physician practitioner and as supervising physician I was immediately available for consultation/collaboration.  Flint Melter, MD 10/18/12 [phone]

## 2012-10-18 NOTE — ED Notes (Signed)
Pt wants to eat

## 2012-10-18 NOTE — ED Notes (Signed)
Pt discharged.Vital signs stable and GCS 15 

## 2012-10-19 NOTE — ED Provider Notes (Signed)
Medical screening examination/treatment/procedure(s) were performed by non-physician practitioner and as supervising physician I was immediately available for consultation/collaboration.   Charles B. Bernette Mayers, MD 10/19/12 [phone]

## 2013-11-08 ENCOUNTER — Emergency Department (HOSPITAL_COMMUNITY)
Admission: EM | Admit: 2013-11-08 | Discharge: 2013-11-08 | Disposition: A | Discharge status: Home / Self Care | Payer: Medicaid Other | Attending: Emergency Medicine | Admitting: Emergency Medicine

## 2013-11-08 ENCOUNTER — Encounter (HOSPITAL_COMMUNITY): Payer: Self-pay | Admitting: Emergency Medicine

## 2013-11-08 ENCOUNTER — Emergency Department (HOSPITAL_COMMUNITY): Payer: Medicaid Other

## 2013-11-08 DIAGNOSIS — Z8719 Personal history of other diseases of the digestive system: Secondary | ICD-10-CM | POA: Insufficient documentation

## 2013-11-08 DIAGNOSIS — Y939 Activity, unspecified: Secondary | ICD-10-CM | POA: Diagnosis not present

## 2013-11-08 DIAGNOSIS — M1990 Unspecified osteoarthritis, unspecified site: Secondary | ICD-10-CM | POA: Diagnosis not present

## 2013-11-08 DIAGNOSIS — S4991XA Unspecified injury of right shoulder and upper arm, initial encounter: Secondary | ICD-10-CM | POA: Insufficient documentation

## 2013-11-08 DIAGNOSIS — Z880 Allergy status to penicillin: Secondary | ICD-10-CM | POA: Insufficient documentation

## 2013-11-08 DIAGNOSIS — W1830XA Fall on same level, unspecified, initial encounter: Secondary | ICD-10-CM | POA: Diagnosis not present

## 2013-11-08 DIAGNOSIS — I10 Essential (primary) hypertension: Secondary | ICD-10-CM | POA: Insufficient documentation

## 2013-11-08 DIAGNOSIS — Y929 Unspecified place or not applicable: Secondary | ICD-10-CM | POA: Diagnosis not present

## 2013-11-08 DIAGNOSIS — M79601 Pain in right arm: Secondary | ICD-10-CM

## 2013-11-08 DIAGNOSIS — Z8659 Personal history of other mental and behavioral disorders: Secondary | ICD-10-CM | POA: Insufficient documentation
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
## 2013-11-08 MED ORDER — METHOCARBAMOL 500 MG PO TABS: 500.0000 mg | ORAL_TABLET | Freq: Two times a day (BID) | ORAL | Status: DC

## 2013-11-08 NOTE — Discharge Instructions (Signed)
Return to the emergency room with worsening of symptoms, new symptoms or with symptoms that are concerning, especially fevers, warmth, swelling, redness, severe worsening pain.  RICE: Rest, Ice (three cycles of 20 mins on, 20mins off at least twice a day), compression/brace, elevation. Heating pad works well for back pain. Ibuprofen 400mg  (2 tablets 200mg ) every 5-6 hours for 3-5 days and then as needed for pain. Muscle relaxer for severe pain. Do not operate machinery, drive or drink alcohol while taking narcotics or muscle relaxers. Follow up with PCP if symptoms worsen or are persistent. Please call your doctor for a followup appointment within 24-48 hours. When you talk to your doctor please let them know that you were seen in the emergency department and have them acquire all of your records so that they can discuss the findings with you and formulate a treatment plan to fully care for your new and ongoing problems.

## 2013-11-08 NOTE — ED Provider Notes (Signed)
CSN: [id number]     Arrival date & time 11/08/13  1554 History  This chart was scribed for Oswaldo Conroy, [place] with Purvis Sheffield, MD by Tonye Royalty, ED Scribe. This patient was seen in room WTR7/WTR7 and the patient's care was started at 5:52 PM.    Chief Complaint  Patient presents with  . Arm Pain  . Fall   The history is provided by the patient. No language interpreter was used.   HPI Comments: Robert Hooper is a 48 y.o. male who presents to the Emergency Department complaining of right arm pain status post falling onto it 2 weeks ago. He states it might be swollen. Pain is improving. Pain increased when patient puts his arm behind his back. He has taken ibuprofen with mild relief. He denies fever, chills, nausea, or vomiting. Patient denies head injury.  Past Medical History  Diagnosis Date  . Hypertension   . Bipolar 1 disorder   . Depression   . Arthritis   . Hemorrhoids    No past surgical history on file. No family history on file. History  Substance Use Topics  . Smoking status: Never Smoker   . Smokeless tobacco: Never Used  . Alcohol Use: No    Review of Systems  Constitutional: Negative for fever and chills.  Gastrointestinal: Negative for nausea and vomiting.  Musculoskeletal:       Right arm pain, thinks there may be swelling      Allergies  Lactose intolerance (gi) and Penicillins  Home Medications   Prior to Admission medications   Medication Sig Start Date End Date Taking? Authorizing Provider  HYDROcodone-acetaminophen (NORCO/VICODIN) 5-325 MG per tablet Take 1 tablet by mouth every 6 (six) hours as needed for pain. 10/17/12   Jamesetta Orleans Lawyer, PA-C  ibuprofen (ADVIL,MOTRIN) 800 MG tablet Take 1 tablet (800 mg total) by mouth every 8 (eight) hours as needed for pain. 10/17/12   Jamesetta Orleans Lawyer, PA-C  lisinopril (PRINIVIL,ZESTRIL) 10 MG tablet Take 10 mg by mouth daily.      Historical Provider, MD  metFORMIN (GLUCOPHAGE) 500 MG tablet Take  500 mg by mouth 2 (two) times daily with a meal.    Historical Provider, MD  methocarbamol (ROBAXIN) 500 MG tablet Take 1 tablet (500 mg total) by mouth 2 (two) times daily. 11/08/13   Louann Sjogren, PA-C  Multiple Vitamin (MULTIVITAMIN WITH MINERALS) TABS Take 1 tablet by mouth daily.    Historical Provider, MD  ondansetron (ZOFRAN) 4 MG tablet Take 1 tablet (4 mg total) by mouth every 6 (six) hours. 10/18/12   Marissa Sciacca, PA-C   BP 123/77  Pulse 85  Temp(Src) 98.7 F (37.1 C)  Resp 18  SpO2 96% Physical Exam  Nursing note and vitals reviewed. Constitutional: He appears well-developed and well-nourished. No distress.  HENT:  Head: Normocephalic and atraumatic.  Eyes: Conjunctivae are normal. Right eye exhibits no discharge. Left eye exhibits no discharge.  Pulmonary/Chest: Effort normal. No respiratory distress.  Musculoskeletal:  Minimal swelling and tenderness to palmar proximal forearm. Muscle tightness to area. No warmth, erythema, lesions or discharge, 2+ radial pulses equal bilaterally, pulse refill <3 seconds  Neurological: He is alert. Coordination normal.  Normal strength 5/5 in upper extremities. Neurovascularly intact.  Skin: He is not diaphoretic.  Psychiatric: He has a normal mood and affect. His behavior is normal.    ED Course  Procedures (including critical care time) Labs Review Labs Reviewed - No data to display  Imaging Review Dg  Forearm Right  11/08/2013   CLINICAL DATA:  Larey SeatFell at home 1-2 weeks ago. Pain to proximal lateral area of right forearm.  EXAM: RIGHT FOREARM - 2 VIEW  COMPARISON:  None.  FINDINGS: Healed fracture deformity involving the distal shaft of the ulna. No acute fracture or subluxation identified. Degenerative changes involving the distal radial ulnar joint noted.  IMPRESSION: 1. No acute bone abnormalities. 2. Chronic changes as above.   Electronically Signed   By: Signa Kellaylor  Stroud M.D.   On: 11/08/2013 16:29     EKG  Interpretation None     DIAGNOSTIC STUDIES: Oxygen Saturation is 100% on room air, normal by my interpretation.    COORDINATION OF CARE: 5:55 PM Discussed with the patient his x-ray results that show no evidence of acute fracture or other problem. I discussed with him that I suspect his symptoms are muscular. I instructed him to take Ibuprofen 3-5 days and apply ice to the area. I will also prescribe a muscle relaxant; he states Flexeril does not work.   MDM   Final diagnoses:  Pain of right upper extremity   Patient X-Ray negative for obvious fracture or dislocation. Pain managed in ED. Pt advised to follow up with PCP if symptoms persist for possibility of missed fracture diagnosis. Dc with muscle relaxer due to muscle tightness, conservative therapy recommended as well and discussed. Driving and sedation precautions provided for muscle relaxer. Patient will be dc home & is agreeable with above plan. Follow up with PCP.  Discussed return precautions with patient. Discussed all results and patient verbalizes understanding and agrees with plan.  I personally performed the services described in this documentation, which was scribed in my presence. The recorded information has been reviewed and is accurate.    Louann SjogrenVictoria L Ramere Downs, PA-C 11/08/13 2153
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
## 2013-11-09 NOTE — ED Provider Notes (Signed)
Medical screening examination/treatment/procedure(s) were performed by non-physician practitioner and as supervising physician I was immediately available for consultation/collaboration.   EKG Interpretation None        Purvis SheffieldForrest D'Arcy Abraha, MD 11/09/13 1635

## 2014-01-15 ENCOUNTER — Emergency Department (HOSPITAL_COMMUNITY)
Admission: EM | Admit: 2014-01-15 | Discharge: 2014-01-16 | Disposition: A | Discharge status: Home / Self Care | Payer: Medicaid Other | Attending: Emergency Medicine | Admitting: Emergency Medicine

## 2014-01-15 DIAGNOSIS — S29001A Unspecified injury of muscle and tendon of front wall of thorax, initial encounter: Secondary | ICD-10-CM | POA: Insufficient documentation

## 2014-01-15 DIAGNOSIS — S299XXA Unspecified injury of thorax, initial encounter: Secondary | ICD-10-CM | POA: Diagnosis not present

## 2014-01-15 DIAGNOSIS — S59901A Unspecified injury of right elbow, initial encounter: Secondary | ICD-10-CM | POA: Insufficient documentation

## 2014-01-15 DIAGNOSIS — Z8659 Personal history of other mental and behavioral disorders: Secondary | ICD-10-CM | POA: Diagnosis not present

## 2014-01-15 DIAGNOSIS — I10 Essential (primary) hypertension: Secondary | ICD-10-CM | POA: Diagnosis not present

## 2014-01-15 DIAGNOSIS — S4991XA Unspecified injury of right shoulder and upper arm, initial encounter: Secondary | ICD-10-CM | POA: Diagnosis not present

## 2014-01-15 DIAGNOSIS — Y9389 Activity, other specified: Secondary | ICD-10-CM | POA: Diagnosis not present

## 2014-01-15 DIAGNOSIS — S8992XA Unspecified injury of left lower leg, initial encounter: Secondary | ICD-10-CM | POA: Diagnosis not present

## 2014-01-15 DIAGNOSIS — Y92410 Unspecified street and highway as the place of occurrence of the external cause: Secondary | ICD-10-CM | POA: Diagnosis not present

## 2014-01-15 DIAGNOSIS — Z8719 Personal history of other diseases of the digestive system: Secondary | ICD-10-CM | POA: Diagnosis not present

## 2014-01-15 DIAGNOSIS — S99911A Unspecified injury of right ankle, initial encounter: Secondary | ICD-10-CM | POA: Diagnosis not present

## 2014-01-15 DIAGNOSIS — S3992XA Unspecified injury of lower back, initial encounter: Secondary | ICD-10-CM | POA: Diagnosis not present

## 2014-01-15 DIAGNOSIS — M1990 Unspecified osteoarthritis, unspecified site: Secondary | ICD-10-CM | POA: Diagnosis not present

## 2014-01-15 DIAGNOSIS — S3991XA Unspecified injury of abdomen, initial encounter: Secondary | ICD-10-CM | POA: Diagnosis not present

## 2014-01-15 DIAGNOSIS — Y998 Other external cause status: Secondary | ICD-10-CM | POA: Diagnosis not present

## 2014-01-15 DIAGNOSIS — Z880 Allergy status to penicillin: Secondary | ICD-10-CM | POA: Insufficient documentation

## 2014-01-15 DIAGNOSIS — Z79899 Other long term (current) drug therapy: Secondary | ICD-10-CM | POA: Diagnosis not present

## 2014-01-15 DIAGNOSIS — S6991XA Unspecified injury of right wrist, hand and finger(s), initial encounter: Secondary | ICD-10-CM | POA: Diagnosis not present

## 2014-01-16 ENCOUNTER — Emergency Department (HOSPITAL_COMMUNITY): Payer: Medicaid Other

## 2014-01-16 ENCOUNTER — Encounter (HOSPITAL_COMMUNITY): Payer: Self-pay | Admitting: Emergency Medicine

## 2014-01-16 LAB — CBC
HEMATOCRIT: 41.8 % (ref 39.0–52.0)
Hemoglobin: 14 g/dL (ref 13.0–17.0)
MCH: 27.5 pg (ref 26.0–34.0)
MCHC: 33.5 g/dL (ref 30.0–36.0)
MCV: 82 fL (ref 78.0–100.0)
Platelets: 171 10*3/uL (ref 150–400)
RBC: 5.1 MIL/uL (ref 4.22–5.81)
RDW: 14.2 % (ref 11.5–15.5)
WBC: 4.6 10*3/uL (ref 4.0–10.5)

## 2014-01-16 LAB — I-STAT CHEM 8, ED
BUN: 13 mg/dL (ref 6–23)
CALCIUM ION: 1.14 mmol/L (ref 1.12–1.23)
CHLORIDE: 106 meq/L (ref 96–112)
Creatinine, Ser: 1.3 mg/dL (ref 0.50–1.35)
Glucose, Bld: 204 mg/dL — ABNORMAL HIGH (ref 70–99)
HEMATOCRIT: 47 % (ref 39.0–52.0)
HEMOGLOBIN: 16 g/dL (ref 13.0–17.0)
Potassium: 3.3 mEq/L — ABNORMAL LOW (ref 3.7–5.3)
Sodium: 140 mEq/L (ref 137–147)
TCO2: 20 mmol/L (ref 0–100)

## 2014-01-16 LAB — SAMPLE TO BLOOD BANK
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
## 2014-01-16 MED ORDER — NAPROXEN 500 MG PO TABS: 500.0000 mg | ORAL_TABLET | Freq: Two times a day (BID) | ORAL | Status: DC

## 2014-01-16 MED ORDER — IOHEXOL 300 MG/ML  SOLN
100.0000 mL | Freq: Once | INTRAMUSCULAR | Status: AC | PRN
  Administered 2014-01-16: 100 mL via INTRAVENOUS

## 2014-01-16 MED ORDER — CYCLOBENZAPRINE HCL 10 MG PO TABS: 10.0000 mg | ORAL_TABLET | Freq: Two times a day (BID) | ORAL | Status: DC | PRN

## 2014-01-16 MED ORDER — MORPHINE SULFATE 4 MG/ML IJ SOLN
4.0000 mg | Freq: Once | INTRAMUSCULAR | Status: AC
  Administered 2014-01-16: 4 mg via INTRAVENOUS
  Filled 2014-01-16: qty 1

## 2014-01-16 MED ORDER — ONDANSETRON HCL 4 MG PO TABS: 4.0000 mg | ORAL_TABLET | Freq: Four times a day (QID) | ORAL | Status: DC

## 2014-01-16 NOTE — ED Notes (Signed)
Pt refused to sign e-signature  

## 2014-01-16 NOTE — ED Notes (Signed)
Pt to xray at this time.
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
## 2014-01-16 NOTE — Discharge Instructions (Signed)

## 2014-03-21 ENCOUNTER — Encounter (HOSPITAL_COMMUNITY): Payer: Self-pay | Admitting: Family Medicine

## 2014-03-21 ENCOUNTER — Emergency Department (HOSPITAL_COMMUNITY)
Admission: EM | Admit: 2014-03-21 | Discharge: 2014-03-21 | Disposition: A | Discharge status: Home / Self Care | Payer: Medicaid Other | Attending: Emergency Medicine | Admitting: Emergency Medicine

## 2014-03-21 ENCOUNTER — Emergency Department (HOSPITAL_COMMUNITY): Payer: Medicaid Other

## 2014-03-21 DIAGNOSIS — I10 Essential (primary) hypertension: Secondary | ICD-10-CM | POA: Diagnosis not present

## 2014-03-21 DIAGNOSIS — F319 Bipolar disorder, unspecified: Secondary | ICD-10-CM | POA: Insufficient documentation

## 2014-03-21 DIAGNOSIS — J111 Influenza due to unidentified influenza virus with other respiratory manifestations: Secondary | ICD-10-CM | POA: Diagnosis not present

## 2014-03-21 DIAGNOSIS — R059 Cough, unspecified: Secondary | ICD-10-CM

## 2014-03-21 DIAGNOSIS — R6889 Other general symptoms and signs: Secondary | ICD-10-CM

## 2014-03-21 DIAGNOSIS — E119 Type 2 diabetes mellitus without complications: Secondary | ICD-10-CM | POA: Diagnosis not present

## 2014-03-21 DIAGNOSIS — R05 Cough: Secondary | ICD-10-CM

## 2014-03-21 HISTORY — DX: Type 2 diabetes mellitus without complications: E11.9

## 2014-03-21 IMAGING — CR DG CHEST 2V
2 series · 2 of 2 positions shown · non-contrast
Comparison: [DATE]

CLINICAL DATA: Three-day history of cough and congestion

EXAM:
CHEST  2 VIEW

[chest pa]
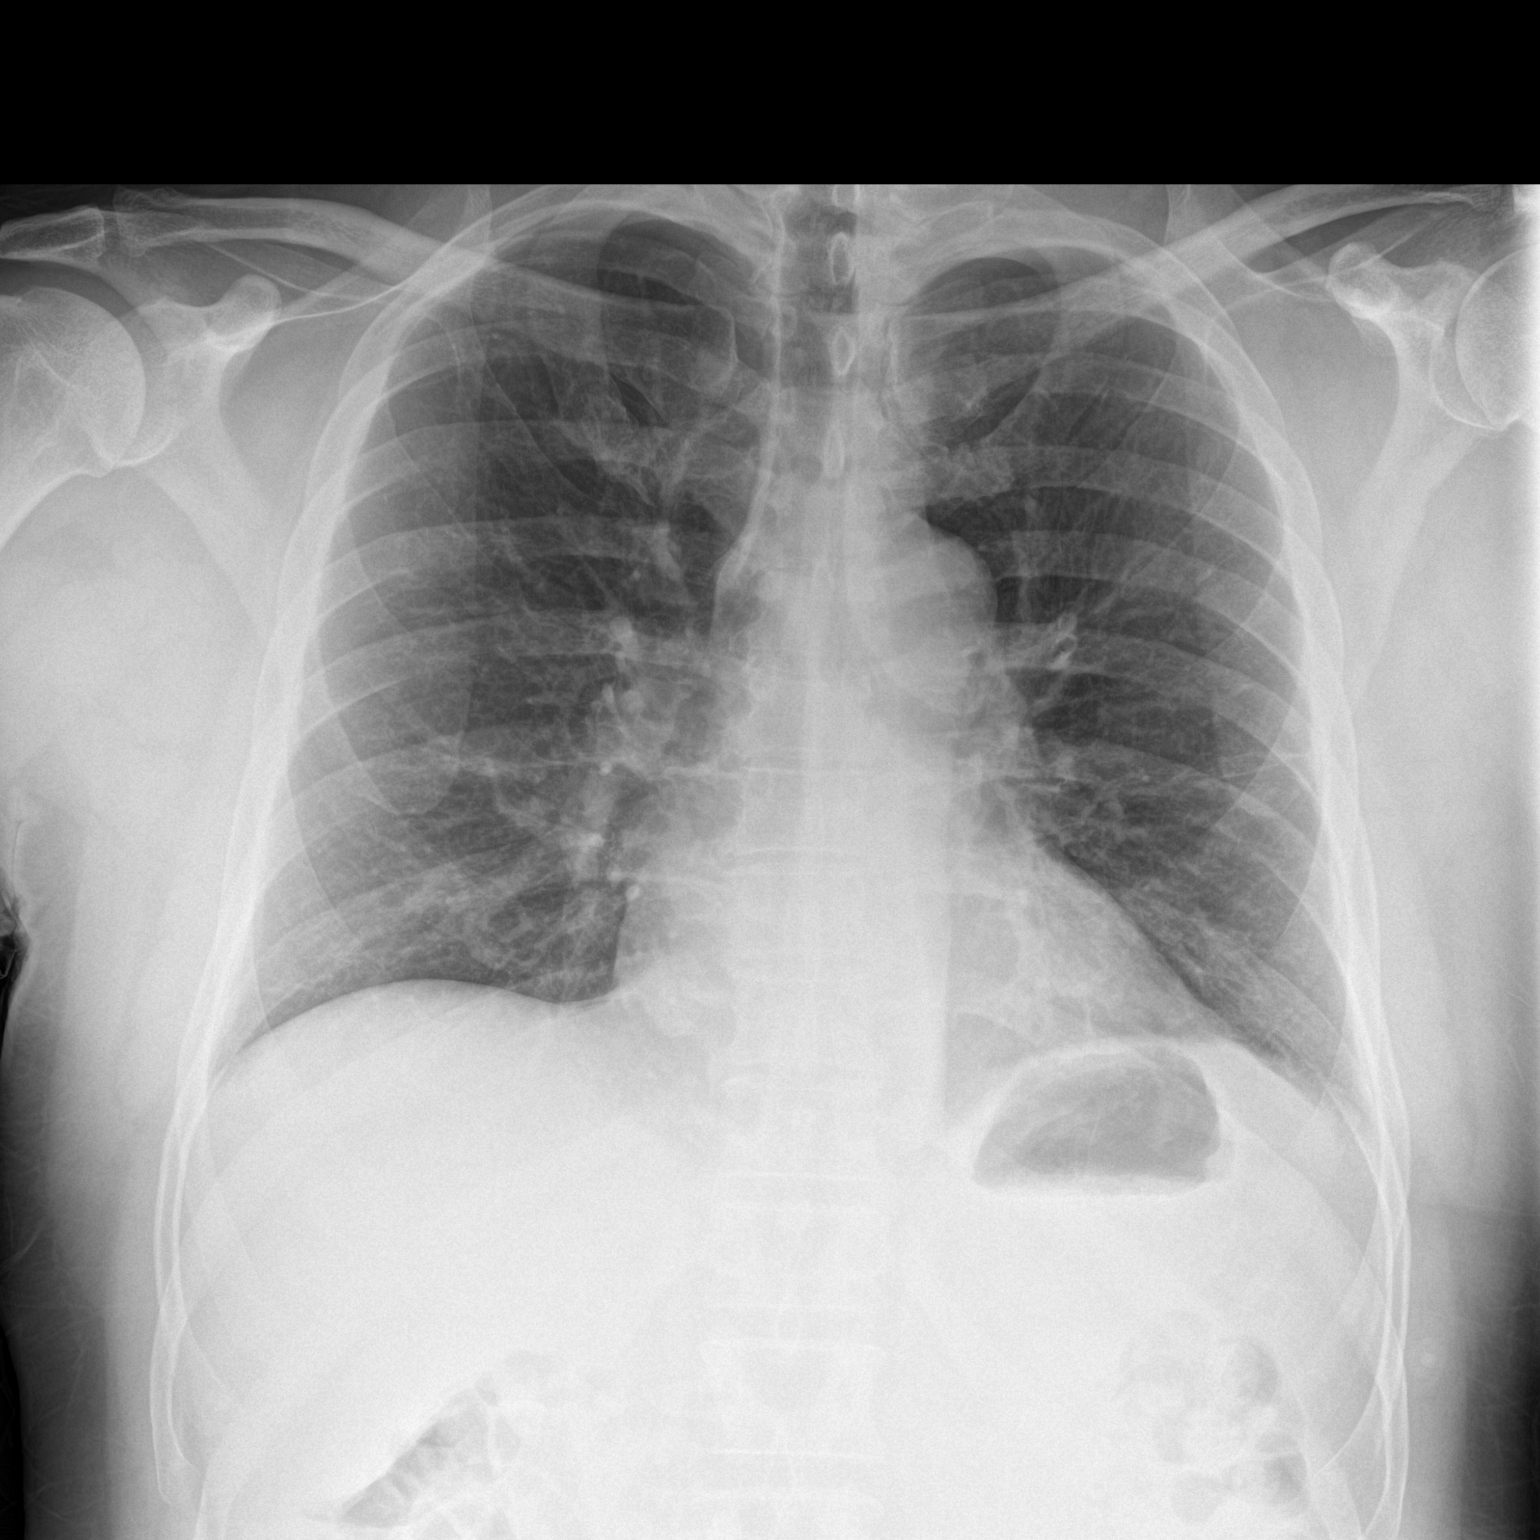

[chest lat]
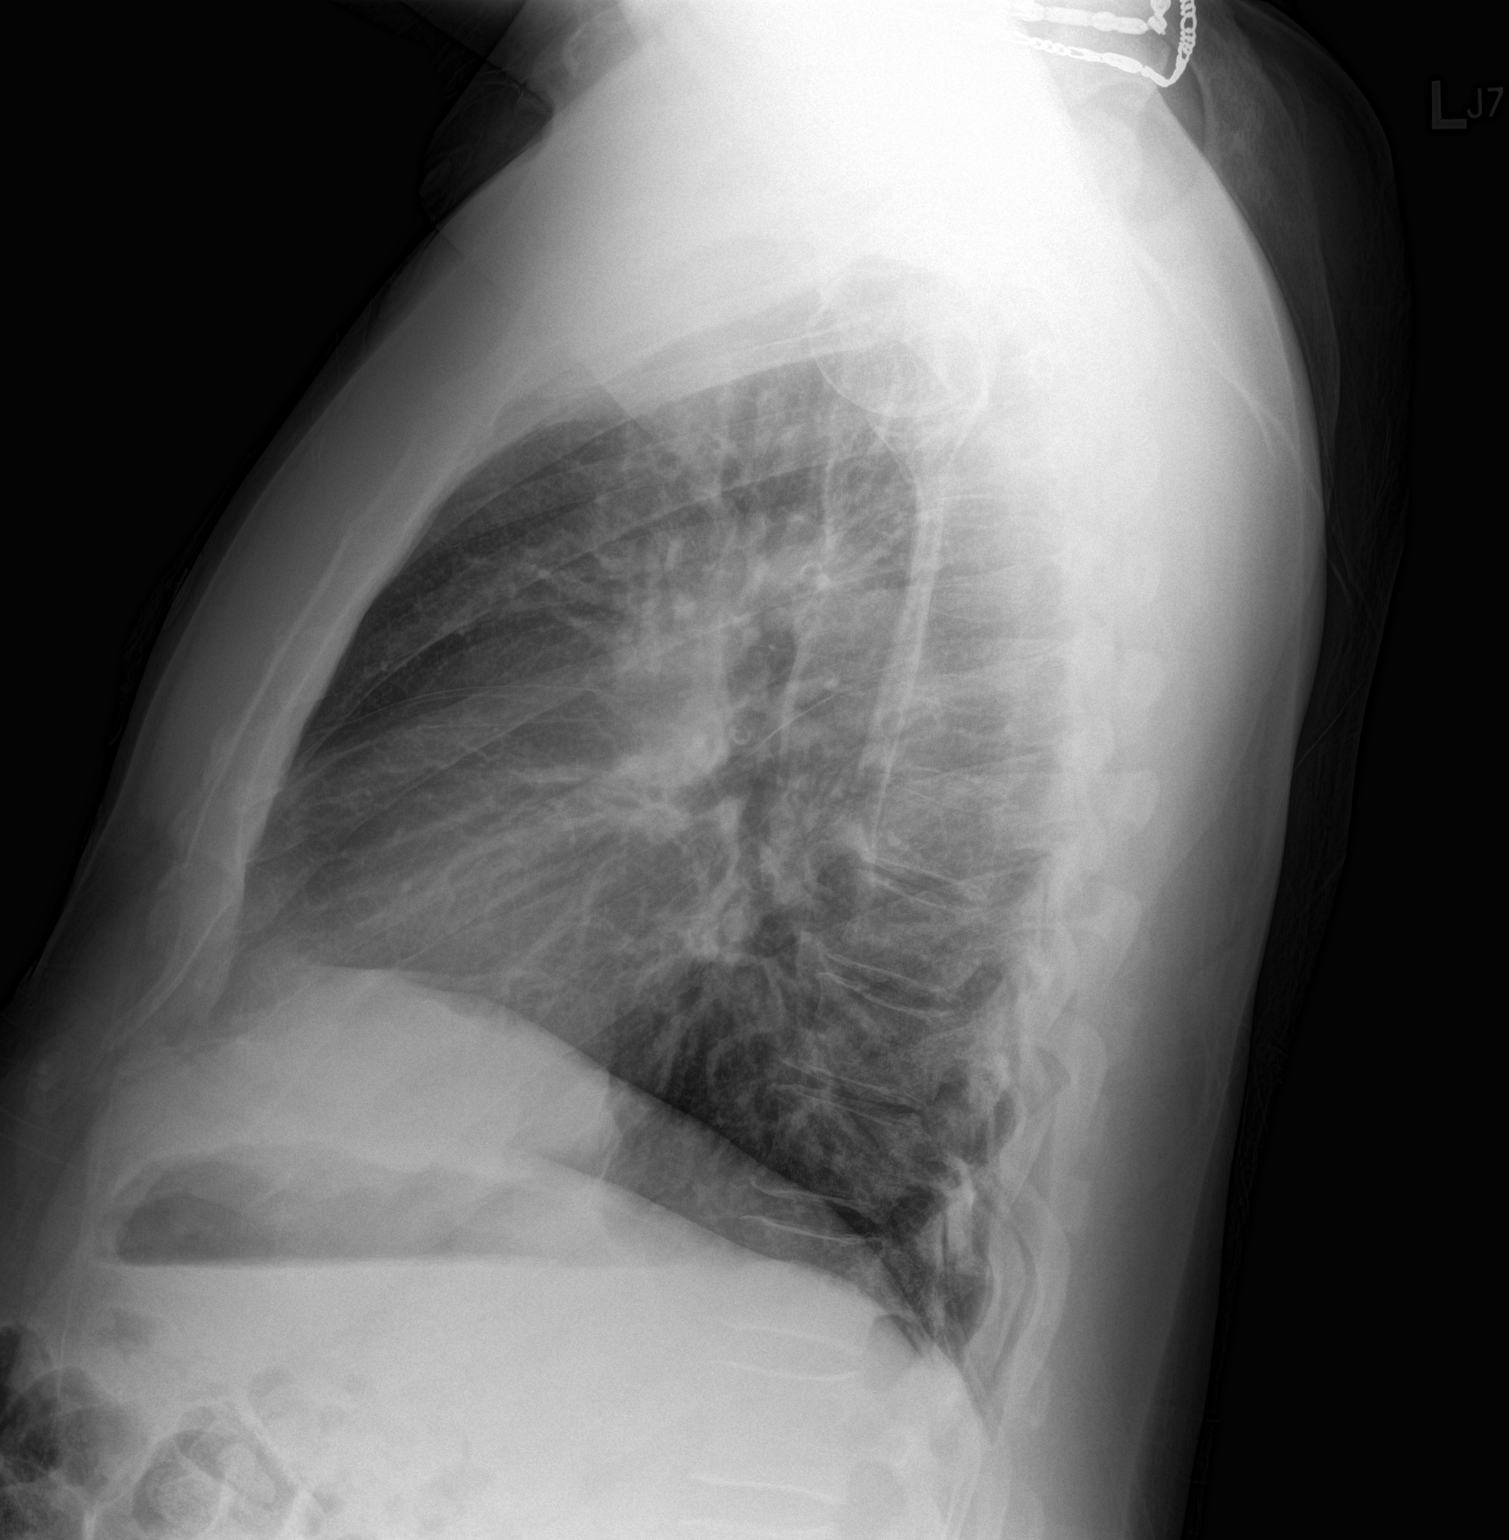

[2 of 2 positions shown; findings below may reference images not displayed]

FINDINGS: Lungs are clear. Heart size and pulmonary vascularity are normal. No
adenopathy. No bone lesions.
IMPRESSION: No edema or consolidation.

## 2014-03-21 MED ORDER — GUAIFENESIN-CODEINE 100-10 MG/5ML PO SOLN
10.0000 mL | Freq: Once | ORAL | Status: AC
  Administered 2014-03-21: 10 mL via ORAL
  Filled 2014-03-21: qty 10

## 2014-03-21 MED ORDER — GUAIFENESIN-CODEINE 100-10 MG/5ML PO SOLN: 10.0000 mL | Freq: Four times a day (QID) | ORAL | Status: DC | PRN

## 2014-03-21 MED ORDER — OSELTAMIVIR PHOSPHATE 75 MG PO CAPS: 75.0000 mg | ORAL_CAPSULE | Freq: Two times a day (BID) | ORAL | Status: DC

## 2014-03-21 MED ORDER — ONDANSETRON 4 MG PO TBDP: 4.0000 mg | ORAL_TABLET | Freq: Three times a day (TID) | ORAL | Status: DC | PRN

## 2014-03-21 MED ORDER — IBUPROFEN 800 MG PO TABS: 800.0000 mg | ORAL_TABLET | Freq: Three times a day (TID) | ORAL | Status: DC | PRN

## 2014-03-21 MED ORDER — IBUPROFEN 800 MG PO TABS
800.0000 mg | ORAL_TABLET | Freq: Once | ORAL | Status: AC
  Administered 2014-03-21: 800 mg via ORAL
  Filled 2014-03-21: qty 1

## 2014-03-21 NOTE — ED Provider Notes (Signed)
TIME SEEN: 7:45 AM  CHIEF COMPLAINT: Flulike symptoms  HPI: Pt is a 49 y.o. male with history of hypertension, diabetes, bipolar disorder who presents emergency department with flulike symptoms. He has had fever, dry cough, chest pain with coughing, nasal congestion, body aches. Wife reports similar symptoms and was tested positive for the flu. Patient denies having a flu vaccination this year. No vomiting diarrhea. No rash. Has had a mild headache. Patient's wife reports she was given Tamiflu is feeling much better.  ROS: See HPI Constitutional:  fever  Eyes: no drainage  ENT:  runny nose   Cardiovascular:   chest pain  Resp: no SOB  GI: no vomiting GU: no dysuria Integumentary: no rash  Allergy: no hives  Musculoskeletal: no leg swelling  Neurological: no slurred speech ROS otherwise negative  PAST MEDICAL HISTORY/PAST SURGICAL HISTORY:  Past Medical History  Diagnosis Date  . Hypertension   . Bipolar 1 disorder   . Depression   . Arthritis   . Hemorrhoids   . Diabetes mellitus without complication     MEDICATIONS:  Prior to Admission medications   Medication Sig Start Date End Date Taking? Authorizing Provider  cyclobenzaprine (FLEXERIL) 10 MG tablet Take 1 tablet (10 mg total) by mouth 2 (two) times daily as needed for muscle spasms. 01/16/14   Linwood Dibbles, MD  HYDROcodone-acetaminophen (NORCO/VICODIN) 5-325 MG per tablet Take 1 tablet by mouth every 6 (six) hours as needed for pain. 10/17/12   Jamesetta Orleans Lawyer, PA-C  ibuprofen (ADVIL,MOTRIN) 800 MG tablet Take 1 tablet (800 mg total) by mouth every 8 (eight) hours as needed for pain. 10/17/12   Jamesetta Orleans Lawyer, PA-C  lisinopril (PRINIVIL,ZESTRIL) 10 MG tablet Take 10 mg by mouth daily.      Historical Provider, MD  metFORMIN (GLUCOPHAGE) 500 MG tablet Take 500 mg by mouth 2 (two) times daily with a meal.    Historical Provider, MD  methocarbamol (ROBAXIN) 500 MG tablet Take 1 tablet (500 mg total) by mouth 2 (two)  times daily. 11/08/13   Louann Sjogren, PA-C  Multiple Vitamin (MULTIVITAMIN WITH MINERALS) TABS Take 1 tablet by mouth daily.    Historical Provider, MD  naproxen (NAPROSYN) 500 MG tablet Take 1 tablet (500 mg total) by mouth 2 (two) times daily. 01/16/14   Linwood Dibbles, MD  ondansetron (ZOFRAN) 4 MG tablet Take 1 tablet (4 mg total) by mouth every 6 (six) hours. 01/16/14   Linwood Dibbles, MD    ALLERGIES:  Allergies  Allergen Reactions  . Lactose Intolerance (Gi) Nausea And Vomiting  . Penicillins Other (See Comments)    Childhood reaction    SOCIAL HISTORY:  History  Substance Use Topics  . Smoking status: Never Smoker   . Smokeless tobacco: Never Used  . Alcohol Use: No    FAMILY HISTORY: History reviewed. No pertinent family history.  EXAM: BP 136/71 mmHg  Pulse 97  Temp(Src) 100.4 F (38 C)  Resp 18  Wt 205 lb (92.987 kg)  SpO2 98% CONSTITUTIONAL: Alert and oriented and responds appropriately to questions. Well-appearing; well-nourished HEAD: Normocephalic EYES: Conjunctivae clear, PERRL ENT: normal nose; no rhinorrhea; moist mucous membranes; pharynx without lesions noted NECK: Supple, no meningismus, no LAD  CARD: RRR; S1 and S2 appreciated; no murmurs, no clicks, no rubs, no gallops RESP: Normal chest excursion without splinting or tachypnea; breath sounds clear and equal bilaterally; no wheezes, no rhonchi, no rales, mild crackles at bilateral bases, no hypoxia ABD/GI: Normal bowel sounds; non-distended; soft,  non-tender, no rebound, no guarding BACK:  The back appears normal and is non-tender to palpation, there is no CVA tenderness EXT: Normal ROM in all joints; non-tender to palpation; no edema; normal capillary refill; no cyanosis    SKIN: Normal color for age and race; warm, no rash NEURO: Moves all extremities equally PSYCH: The patient's mood and manner are appropriate. Grooming and personal hygiene are appropriate.  MEDICAL DECISION MAKING: Patient here with  flulike symptoms with the wife recently tested positive for influenza. It appears they are likely here for prescription for Tamiflu. Patient is right at the treatment window given symptoms started between 48 and 72 hours ago. Given he does have bibasilar crackles will obtain chest x-ray. He is complaining of chest pain but states this is from coughing but given his risk factors will obtain an EKG. Will give ibuprofen, guaifenesin with codeine for symptom relief. He is otherwise well-appearing, nontoxic. Anticipate discharge home.  ED PROGRESS: Patient reports some improvement in symptoms. Chest x-ray clear. EKG nonischemic. We'll discharge with prescriptions for Tamiflu, guaifenesin with codeine. Have advised them to alternate Tylenol and Motrin over-the-counter. Discussed return precautions. He verbalizes understanding and is comfortable with plan.     Layla MawKristen N Jerrye Seebeck, DO 03/21/14 [phone]

## 2014-03-21 NOTE — ED Notes (Signed)
Pt here for cough, congestion x 2 days. Denies any other symptoms.

## 2014-03-21 NOTE — Discharge Instructions (Signed)
You may alternate between 1000 mg of Tylenol every 6 hours as needed for fever and pain and 800 mg of ibuprofen every 8 hours as needed for fever and pain. Please take ibuprofen with food.   Influenza Influenza ("the flu") is a viral infection of the respiratory tract. It occurs more often in winter months because people spend more time in close contact with one another. Influenza can make you feel very sick. Influenza easily spreads from person to person (contagious). CAUSES  Influenza is caused by a virus that infects the respiratory tract. You can catch the virus by breathing in droplets from an infected person's cough or sneeze. You can also catch the virus by touching something that was recently contaminated with the virus and then touching your mouth, nose, or eyes. RISKS AND COMPLICATIONS You may be at risk for a more severe case of influenza if you smoke cigarettes, have diabetes, have chronic heart disease (such as heart failure) or lung disease (such as asthma), or if you have a weakened immune system. Elderly people and pregnant women are also at risk for more serious infections. The most common problem of influenza is a lung infection (pneumonia). Sometimes, this problem can require emergency medical care and may be life threatening. SIGNS AND SYMPTOMS  Symptoms typically last 4 to 10 days and may include:  Fever.  Chills.  Headache, body aches, and muscle aches.  Sore throat.  Chest discomfort and cough.  Poor appetite.  Weakness or feeling tired.  Dizziness.  Nausea or vomiting. DIAGNOSIS  Diagnosis of influenza is often made based on your history and a physical exam. A nose or throat swab test can be done to confirm the diagnosis. TREATMENT  In mild cases, influenza goes away on its own. Treatment is directed at relieving symptoms. For more severe cases, your health care provider may prescribe antiviral medicines to shorten the sickness. Antibiotic medicines are not  effective because the infection is caused by a virus, not by bacteria. HOME CARE INSTRUCTIONS  Take medicines only as directed by your health care provider.  Use a cool mist humidifier to make breathing easier.  Get plenty of rest until your temperature returns to normal. This usually takes 3 to 4 days.  Drink enough fluid to keep your urine clear or pale yellow.  Cover yourmouth and nosewhen coughing or sneezing,and wash your handswellto prevent thevirusfrom spreading.  Stay homefromwork orschool untilthe fever is gonefor at least 781full day. PREVENTION  An annual influenza vaccination (flu shot) is the best way to avoid getting influenza. An annual flu shot is now routinely recommended for all adults in the [place] SEEK MEDICAL CARE IF:  You experiencechest pain, yourcough worsens,or you producemore mucus.  Youhave nausea,vomiting, ordiarrhea.  Your fever returns or gets worse. SEEK IMMEDIATE MEDICAL CARE IF:  You havetrouble breathing, you become short of breath,or your skin ornails becomebluish.  You have severe painor stiffnessin the neck.  You develop a sudden headache, or pain in the face or ear.  You have nausea or vomiting that you cannot control. MAKE SURE YOU:   Understand these instructions.  Will watch your condition.  Will get help right away if you are not doing well or get worse. Document Released: 01/17/2000 Document Revised: 06/05/2013 Document Reviewed: 04/20/2011 [facility] Patient Information 2015 [employer], [place]. This information is not intended to replace advice given to you by your health care provider. Make sure you discuss any questions you have with your health care provider.

## 2014-03-21 NOTE — ED Notes (Signed)
Patient transported to X-ray 

## 2015-09-11 ENCOUNTER — Encounter (HOSPITAL_COMMUNITY): Payer: Self-pay | Admitting: Emergency Medicine

## 2015-09-11 ENCOUNTER — Emergency Department (HOSPITAL_COMMUNITY): Payer: No Typology Code available for payment source

## 2015-09-11 DIAGNOSIS — Y999 Unspecified external cause status: Secondary | ICD-10-CM | POA: Insufficient documentation

## 2015-09-11 DIAGNOSIS — Z79899 Other long term (current) drug therapy: Secondary | ICD-10-CM | POA: Diagnosis not present

## 2015-09-11 DIAGNOSIS — Y92410 Unspecified street and highway as the place of occurrence of the external cause: Secondary | ICD-10-CM | POA: Diagnosis not present

## 2015-09-11 DIAGNOSIS — S161XXA Strain of muscle, fascia and tendon at neck level, initial encounter: Secondary | ICD-10-CM | POA: Insufficient documentation

## 2015-09-11 DIAGNOSIS — S060X1A Concussion with loss of consciousness of 30 minutes or less, initial encounter: Secondary | ICD-10-CM | POA: Diagnosis not present

## 2015-09-11 DIAGNOSIS — S0990XA Unspecified injury of head, initial encounter: Secondary | ICD-10-CM | POA: Diagnosis present

## 2015-09-11 DIAGNOSIS — Y939 Activity, unspecified: Secondary | ICD-10-CM | POA: Insufficient documentation

## 2015-09-11 DIAGNOSIS — S39012A Strain of muscle, fascia and tendon of lower back, initial encounter: Secondary | ICD-10-CM | POA: Diagnosis not present

## 2015-09-11 DIAGNOSIS — E119 Type 2 diabetes mellitus without complications: Secondary | ICD-10-CM | POA: Insufficient documentation

## 2015-09-11 DIAGNOSIS — I10 Essential (primary) hypertension: Secondary | ICD-10-CM | POA: Insufficient documentation

## 2015-09-11 DIAGNOSIS — Z7984 Long term (current) use of oral hypoglycemic drugs: Secondary | ICD-10-CM | POA: Insufficient documentation
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
## 2015-09-11 NOTE — ED Notes (Signed)
Nurse explained process / wait time to  pt. and reason why he has to wait at waiting area , pt. became verbally abusive with nurse during triage .
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
## 2015-09-12 ENCOUNTER — Emergency Department (HOSPITAL_COMMUNITY): Payer: No Typology Code available for payment source

## 2015-09-12 ENCOUNTER — Emergency Department (HOSPITAL_COMMUNITY)
Admission: EM | Admit: 2015-09-12 | Discharge: 2015-09-12 | Disposition: A | Discharge status: Home / Self Care | Payer: No Typology Code available for payment source | Attending: Emergency Medicine | Admitting: Emergency Medicine

## 2015-09-12 DIAGNOSIS — S161XXA Strain of muscle, fascia and tendon at neck level, initial encounter: Secondary | ICD-10-CM

## 2015-09-12 DIAGNOSIS — S060X1A Concussion with loss of consciousness of 30 minutes or less, initial encounter: Secondary | ICD-10-CM

## 2015-09-12 DIAGNOSIS — S39012A Strain of muscle, fascia and tendon of lower back, initial encounter: Secondary | ICD-10-CM

## 2015-09-12 LAB — CBG MONITORING, ED: GLUCOSE-CAPILLARY: 148 mg/dL — AB (ref 65–99)
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
## 2015-09-12 MED ORDER — HYDROCODONE-ACETAMINOPHEN 5-325 MG PO TABS
1.0000 | ORAL_TABLET | Freq: Once | ORAL | Status: AC
  Administered 2015-09-12: 1 via ORAL
  Filled 2015-09-12: qty 1

## 2015-09-12 MED ORDER — ONDANSETRON 4 MG PO TBDP
8.0000 mg | ORAL_TABLET | Freq: Once | ORAL | Status: AC
  Administered 2015-09-12: 8 mg via ORAL
  Filled 2015-09-12: qty 2

## 2015-09-12 NOTE — ED Notes (Signed)
Patient transported to CT 

## 2015-09-12 NOTE — Discharge Instructions (Signed)

## 2015-09-12 NOTE — ED Notes (Signed)
Patient was given a diet ginger ale with gram crackers and peanut butter.
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
## 2016-11-06 ENCOUNTER — Ambulatory Visit (INDEPENDENT_AMBULATORY_CARE_PROVIDER_SITE_OTHER): Payer: Medicaid Other | Admitting: Podiatry

## 2016-11-06 ENCOUNTER — Encounter: Payer: Self-pay | Admitting: Podiatry

## 2016-11-06 VITALS — BP 130/73 | HR 86 | Resp 16

## 2016-11-06 DIAGNOSIS — M21619 Bunion of unspecified foot: Secondary | ICD-10-CM

## 2016-11-06 DIAGNOSIS — L309 Dermatitis, unspecified: Secondary | ICD-10-CM

## 2016-11-06 NOTE — Progress Notes (Signed)
Subjective:    Patient ID: Robert Hooper, male   DOB: 51 y.o.   MRN: [id number]   HPI patient presents stating his feet sweat and at times can be discomforting    Review of Systems  All other systems reviewed and are negative.       Objective:  Physical Exam  Constitutional: He appears well-developed and well-nourished.  Cardiovascular: Intact distal pulses.   Pulmonary/Chest: Effort normal.  Musculoskeletal: Normal range of motion.  Neurological: He is alert.  Skin: Skin is warm.  Nursing note and vitals reviewed.  neurovascular status intact muscle strength adequate range of motion within normal limits with patient found to have excessive moisture in his feet with slight irritation between the lesser digits of both feet and discoloration of the left hallux     Assessment:   Probability for dermatitis in conjunction with mycotic nail infection      Plan:    H&P both conditions discussed at great length. We'll start powders along with change and socks twice a day and Lamisil cream between the toes and he will be seen back as needed

## 2016-11-06 NOTE — Progress Notes (Signed)
   Subjective:    Patient ID: Robert Hooper, male    DOB: 12/21/1965, 51 y.o.   MRN: [id number]  HPI    Review of Systems  All other systems reviewed and are negative.      Objective:   Physical Exam        Assessment & Plan:

## 2017-03-03 ENCOUNTER — Other Ambulatory Visit: Payer: Self-pay

## 2017-03-03 ENCOUNTER — Emergency Department (HOSPITAL_COMMUNITY)
Admission: EM | Admit: 2017-03-03 | Discharge: 2017-03-03 | Disposition: A | Discharge status: Home / Self Care | Payer: Medicaid Other | Attending: Emergency Medicine | Admitting: Emergency Medicine

## 2017-03-03 ENCOUNTER — Encounter (HOSPITAL_COMMUNITY): Payer: Self-pay | Admitting: Emergency Medicine

## 2017-03-03 ENCOUNTER — Emergency Department (HOSPITAL_COMMUNITY): Payer: Medicaid Other

## 2017-03-03 DIAGNOSIS — I10 Essential (primary) hypertension: Secondary | ICD-10-CM | POA: Insufficient documentation

## 2017-03-03 DIAGNOSIS — R05 Cough: Secondary | ICD-10-CM | POA: Diagnosis present

## 2017-03-03 DIAGNOSIS — J069 Acute upper respiratory infection, unspecified: Secondary | ICD-10-CM | POA: Insufficient documentation

## 2017-03-03 DIAGNOSIS — E119 Type 2 diabetes mellitus without complications: Secondary | ICD-10-CM | POA: Diagnosis not present

## 2017-03-03 DIAGNOSIS — B9789 Other viral agents as the cause of diseases classified elsewhere: Secondary | ICD-10-CM

## 2017-03-03 DIAGNOSIS — Z7984 Long term (current) use of oral hypoglycemic drugs: Secondary | ICD-10-CM | POA: Insufficient documentation

## 2017-03-03 MED ORDER — GUAIFENESIN-CODEINE 100-10 MG/5ML PO SYRP: 5.0000 mL | ORAL_SOLUTION | Freq: Three times a day (TID) | ORAL | 0 refills | Status: DC | PRN

## 2017-03-03 MED ORDER — BENZONATATE 100 MG PO CAPS: 100.0000 mg | ORAL_CAPSULE | Freq: Three times a day (TID) | ORAL | 0 refills | Status: DC

## 2017-03-03 NOTE — ED Provider Notes (Signed)
[facility] EMERGENCY DEPARTMENT Provider Note   CSN: [id number] Arrival date & time: 03/03/17  0532     History   Chief Complaint Chief Complaint  Patient presents with  . Cough    HPI Robert Hooper is a 52 y.o. male.  HPI  Robert Hooper is a 52 year old male with a history of bipolar disorder, hypertension, and NIDDM who presents to the emergency department for evaluation of cough.  Patient states that he had a cough which began about 2 weeks ago, seemed to be improving.  Yesterday he reports that the cough returned and is nonproductive.  He endorses some associated chest tightness.  Has tried taking over-the-counter Robitussin and Alka-Seltzer plus without significant relief.  States that he has one close contact with similar symptoms.  Denies fever, chills, headache, sore throat, congestion, ear pain, wheezing, shortness of breath, chest pain, abdominal pain, N/V, dysuria, leg swelling, calf pain.  Denies tobacco use.  Past Medical History:  Diagnosis Date  . Arthritis   . Bipolar 1 disorder (HCC)   . Depression   . Diabetes mellitus without complication (HCC)   . Hemorrhoids   . Hypertension     Patient Active Problem List   Diagnosis Date Noted  . Internal hemorrhoid, bleeding 09/20/2012    History reviewed. No pertinent surgical history.     Home Medications    Prior to Admission medications   Medication Sig Start Date End Date Taking? Authorizing Provider  ALPRAZolam Prudy Feeler) 1 MG tablet Take 1 mg by mouth 4 (four) times daily as needed for anxiety.  08/16/15  Yes [provider]  FLUoxetine (PROZAC) 20 MG tablet Take 40 mg by mouth daily. 08/23/15  Yes [provider]  lisinopril (PRINIVIL,ZESTRIL) 10 MG tablet Take 10 mg by mouth daily.     Yes [provider]  metFORMIN (GLUCOPHAGE) 500 MG tablet Take 500 mg by mouth 2 (two) times daily with a meal.   Yes [provider]  Multiple Vitamin (MULTIVITAMIN  WITH MINERALS) TABS Take 1 tablet by mouth daily.   Yes [provider]  QUEtiapine (SEROQUEL) 400 MG tablet Take 400 mg by mouth at bedtime.   Yes [provider]  simvastatin (ZOCOR) 10 MG tablet Take 10 mg by mouth at bedtime. 09/03/15  Yes [provider]  amphetamine-dextroamphetamine (ADDERALL) 30 MG tablet Take 30 mg by mouth daily.    [provider]  cyclobenzaprine (FLEXERIL) 10 MG tablet Take 1 tablet (10 mg total) by mouth 2 (two) times daily as needed for muscle spasms. Patient not taking: Reported on 03/03/2017 01/16/14   Linwood Dibbles, MD  guaiFENesin-codeine 100-10 MG/5ML syrup Take 10 mLs by mouth every 6 (six) hours as needed for cough. Patient not taking: Reported on 03/03/2017 03/21/14   Ward, Layla Maw, DO  ibuprofen (ADVIL,MOTRIN) 800 MG tablet Take 1 tablet (800 mg total) by mouth every 8 (eight) hours as needed for mild pain. Patient not taking: Reported on 03/03/2017 03/21/14   Ward, Layla Maw, DO  methocarbamol (ROBAXIN) 500 MG tablet Take 1 tablet (500 mg total) by mouth 2 (two) times daily. Patient not taking: Reported on 03/03/2017 11/08/13   Oswaldo Conroy, PA-C  naproxen (NAPROSYN) 500 MG tablet Take 1 tablet (500 mg total) by mouth 2 (two) times daily. Patient not taking: Reported on 03/03/2017 01/16/14   Linwood Dibbles, MD  ondansetron (ZOFRAN ODT) 4 MG disintegrating tablet Take 1 tablet (4 mg total) by mouth every 8 (eight) hours as needed  for nausea or vomiting. Patient not taking: Reported on 03/03/2017 03/21/14   Ward, Layla Maw, DO  ondansetron (ZOFRAN) 4 MG tablet Take 1 tablet (4 mg total) by mouth every 6 (six) hours. Patient not taking: Reported on 03/03/2017 01/16/14   Linwood Dibbles, MD  oseltamivir (TAMIFLU) 75 MG capsule Take 1 capsule (75 mg total) by mouth every 12 (twelve) hours. Patient not taking: Reported on 03/03/2017 03/21/14   Ward, Layla Maw, DO    Family History No family history on file.  Social History Social History    Tobacco Use  . Smoking status: Never Smoker  . Smokeless tobacco: Never Used  Substance Use Topics  . Alcohol use: No  . Drug use: No     Allergies   Lactose intolerance (gi) and Penicillins   Review of Systems Review of Systems  Constitutional: Negative for chills, fatigue and fever.  HENT: Negative for congestion, ear pain, rhinorrhea, sore throat and trouble swallowing.   Respiratory: Positive for cough and chest tightness. Negative for shortness of breath and wheezing.   Cardiovascular: Negative for chest pain and leg swelling.  Gastrointestinal: Negative for abdominal pain, diarrhea, nausea and vomiting.  Genitourinary: Negative for dysuria.  Musculoskeletal: Negative for myalgias.  Skin: Negative for rash.  Neurological: Negative for headaches.  Psychiatric/Behavioral: Negative for agitation.     Physical Exam Updated Vital Signs BP 125/83   Pulse 87   Temp 98.1 F (36.7 C) (Oral)   Resp 18   Ht 5\' 8"  (1.727 m)   Wt 99.8 kg (220 lb)   SpO2 96%   BMI 33.45 kg/m   Physical Exam  Constitutional: He is oriented to person, place, and time. He appears well-developed and well-nourished. No distress.  HENT:  Head: Normocephalic and atraumatic.  Right Ear: External ear normal.  Left Ear: External ear normal.  Bilateral TMs with good cone of light.  Mucous membranes moist.  Posterior oropharynx without erythema.  No tonsillar exudate.  Eyes: Conjunctivae are normal. Pupils are equal, round, and reactive to light. Right eye exhibits no discharge. Left eye exhibits no discharge.  Neck: Normal range of motion. Neck supple.  Cardiovascular: Normal rate, regular rhythm and intact distal pulses. Exam reveals no friction rub.  No murmur heard. Pulmonary/Chest: Effort normal and breath sounds normal. No stridor. No respiratory distress. He has no wheezes. He has no rales.  No respiratory distress. Speaking in full sentences. Lungs CTA.   Abdominal: Soft. Bowel sounds are  normal. There is no tenderness. There is no guarding.  Musculoskeletal: Normal range of motion.  Lymphadenopathy:    He has no cervical adenopathy.  Neurological: He is alert and oriented to person, place, and time. Coordination normal.  Skin: Skin is warm and dry. He is not diaphoretic.  Psychiatric: He has a normal mood and affect. His behavior is normal.  Nursing note and vitals reviewed.    ED Treatments / Results  Labs (all labs ordered are listed, but only abnormal results are displayed) Labs Reviewed - No data to display  EKG  EKG Interpretation None       Radiology Dg Chest 2 View  Result Date: 03/03/2017 CLINICAL DATA:  Cough for 2 weeks. EXAM: CHEST  2 VIEW COMPARISON:  03/21/2014 FINDINGS: The heart size and mediastinal contours are within normal limits. Both lungs are clear. The visualized skeletal structures are unremarkable. IMPRESSION: No active cardiopulmonary disease. Electronically Signed   By: Burman Nieves M.D.   On: 03/03/2017 06:29  Procedures Procedures (including critical care time)  Medications Ordered in ED Medications - No data to display   Initial Impression / Assessment and Plan / ED Course  I have reviewed the triage vital signs and the nursing notes.  Pertinent labs & imaging results that were available during my care of the patient were reviewed by me and considered in my medical decision making (see chart for details).    Pt CXR negative for acute infiltrate. Patients symptoms are consistent with URI, likely viral etiology. Discussed that antibiotics are not indicated for viral infections. Pt will be discharged with symptomatic treatment.  Verbalizes understanding and is agreeable with plan. Pt is hemodynamically stable & in NAD prior to dc.  Final Clinical Impressions(s) / ED Diagnoses   Final diagnoses:  Viral URI with cough    ED Discharge Orders        Ordered    guaiFENesin-codeine [facility](ROBITUSSIN AC) 100-10 MG/5ML syrup  3  times daily PRN     03/03/17 0717    benzonatate (TESSALON) 100 MG capsule  Every 8 hours     03/03/17 0717       Kellie ShropshireShrosbree, Soyla Bainter J, PA-C 03/04/17 1153    Glynn Octaveancour, Stephen, MD 03/04/17 1843

## 2017-03-03 NOTE — ED Notes (Signed)
Patient transported to X-ray 

## 2017-03-03 NOTE — ED Triage Notes (Addendum)
Pt in from home with c/o cough x 2.5 wks. States cough had resolved until yesterday. Worse today, coughing yellow sputum. Denies fevers, sore throat, sob or cp

## 2017-03-03 NOTE — Discharge Instructions (Signed)
Your x-rays reassuring.  No pneumonia.  Follow up with your PCP if symptoms are not improving in a week.   I have prescribed you a cough medicine with codeine in it. It can make you drowsy so please do not drive, work or drink alcohol while taking it.

## 2017-08-28 ENCOUNTER — Other Ambulatory Visit: Payer: Self-pay

## 2017-08-28 ENCOUNTER — Emergency Department (HOSPITAL_COMMUNITY)
Admission: EM | Admit: 2017-08-28 | Discharge: 2017-08-28 | Disposition: A | Discharge status: Home / Self Care | Payer: Medicaid Other | Attending: Emergency Medicine | Admitting: Emergency Medicine

## 2017-08-28 ENCOUNTER — Encounter (HOSPITAL_COMMUNITY): Payer: Self-pay | Admitting: Nurse Practitioner

## 2017-08-28 ENCOUNTER — Emergency Department (HOSPITAL_COMMUNITY): Payer: Medicaid Other

## 2017-08-28 DIAGNOSIS — I10 Essential (primary) hypertension: Secondary | ICD-10-CM | POA: Insufficient documentation

## 2017-08-28 DIAGNOSIS — S90932A Unspecified superficial injury of left great toe, initial encounter: Secondary | ICD-10-CM | POA: Diagnosis present

## 2017-08-28 DIAGNOSIS — Y929 Unspecified place or not applicable: Secondary | ICD-10-CM | POA: Insufficient documentation

## 2017-08-28 DIAGNOSIS — Z23 Encounter for immunization: Secondary | ICD-10-CM | POA: Insufficient documentation

## 2017-08-28 DIAGNOSIS — Z7984 Long term (current) use of oral hypoglycemic drugs: Secondary | ICD-10-CM | POA: Insufficient documentation

## 2017-08-28 DIAGNOSIS — S90222A Contusion of left lesser toe(s) with damage to nail, initial encounter: Secondary | ICD-10-CM

## 2017-08-28 DIAGNOSIS — E119 Type 2 diabetes mellitus without complications: Secondary | ICD-10-CM | POA: Diagnosis not present

## 2017-08-28 DIAGNOSIS — W2209XA Striking against other stationary object, initial encounter: Secondary | ICD-10-CM | POA: Insufficient documentation

## 2017-08-28 DIAGNOSIS — S90212A Contusion of left great toe with damage to nail, initial encounter: Secondary | ICD-10-CM | POA: Diagnosis not present

## 2017-08-28 DIAGNOSIS — Y999 Unspecified external cause status: Secondary | ICD-10-CM | POA: Diagnosis not present

## 2017-08-28 DIAGNOSIS — Y939 Activity, unspecified: Secondary | ICD-10-CM | POA: Diagnosis not present

## 2017-08-28 DIAGNOSIS — Z79899 Other long term (current) drug therapy: Secondary | ICD-10-CM | POA: Insufficient documentation

## 2017-08-28 LAB — CBG MONITORING, ED: Glucose-Capillary: 127 mg/dL — ABNORMAL HIGH (ref 70–99)
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
## 2017-08-28 MED ORDER — TETANUS-DIPHTH-ACELL PERTUSSIS 5-2.5-18.5 LF-MCG/0.5 IM SUSP
0.5000 mL | Freq: Once | INTRAMUSCULAR | Status: AC
  Administered 2017-08-28: 0.5 mL via INTRAMUSCULAR
  Filled 2017-08-28: qty 0.5

## 2017-08-28 MED ORDER — BACITRACIN ZINC 500 UNIT/GM EX OINT
TOPICAL_OINTMENT | Freq: Once | CUTANEOUS | Status: AC
  Administered 2017-08-28: 1 via TOPICAL
  Filled 2017-08-28: qty 0.9

## 2017-08-28 NOTE — Discharge Instructions (Addendum)
Follow up with your doctor later this week for recheck. Return here sooner for any problems.

## 2017-08-28 NOTE — ED Triage Notes (Signed)
Pt is c/o left big toe pain that he states he accidentally hit and nail almost came off.

## 2017-08-28 NOTE — ED Provider Notes (Signed)
[facility]-EMERGENCY DEPT Provider Note   CSN: [id number] Arrival date & time: 08/28/17  1829     History   Chief Complaint Chief Complaint  Patient presents with  . Toe Pain    Left Toe    HPI Robert Hooper is a 52 y.o. male who presents to the ED with pain to the left great toe. Patient reports he accidentally hit his toe and the nail almost came off.    HPI  Past Medical History:  Diagnosis Date  . Arthritis   . Bipolar 1 disorder (HCC)   . Depression   . Diabetes mellitus without complication (HCC)   . Hemorrhoids   . Hypertension     Patient Active Problem List   Diagnosis Date Noted  . Internal hemorrhoid, bleeding 09/20/2012    History reviewed. No pertinent surgical history.      Home Medications    Prior to Admission medications   Medication Sig Start Date End Date Taking? Authorizing Provider  ALPRAZolam Prudy Feeler(XANAX) 1 MG tablet Take 1 mg by mouth 4 (four) times daily as needed for anxiety.  08/16/15   [provider]  amphetamine-dextroamphetamine (ADDERALL) 30 MG tablet Take 30 mg by mouth daily.    [provider]  benzonatate (TESSALON) 100 MG capsule Take 1 capsule (100 mg total) by mouth every 8 (eight) hours. 03/03/17   Kellie ShropshireShrosbree, Emily J, PA-C  FLUoxetine (PROZAC) 20 MG tablet Take 40 mg by mouth daily. 08/23/15   [provider]  guaiFENesin-codeine (ROBITUSSIN AC) 100-10 MG/5ML syrup Take 5 mLs by mouth 3 (three) times daily as needed for cough. 03/03/17   Kellie ShropshireShrosbree, Emily J, PA-C  guaiFENesin-codeine 100-10 MG/5ML syrup Take 10 mLs by mouth every 6 (six) hours as needed for cough. Patient not taking: Reported on 03/03/2017 03/21/14   Ward, Layla MawKristen N, DO  ibuprofen (ADVIL,MOTRIN) 800 MG tablet Take 1 tablet (800 mg total) by mouth every 8 (eight) hours as needed for mild pain. Patient not taking: Reported on 03/03/2017 03/21/14   Ward, Layla MawKristen N, DO  lisinopril (PRINIVIL,ZESTRIL) 10 MG tablet Take 10 mg by  mouth daily.      [provider]  metFORMIN (GLUCOPHAGE) 500 MG tablet Take 500 mg by mouth 2 (two) times daily with a meal.    [provider]  methocarbamol (ROBAXIN) 500 MG tablet Take 1 tablet (500 mg total) by mouth 2 (two) times daily. Patient not taking: Reported on 03/03/2017 11/08/13   Oswaldo Conroyreech, Victoria, PA-C  Multiple Vitamin (MULTIVITAMIN WITH MINERALS) TABS Take 1 tablet by mouth daily.    [provider]  naproxen (NAPROSYN) 500 MG tablet Take 1 tablet (500 mg total) by mouth 2 (two) times daily. Patient not taking: Reported on 03/03/2017 01/16/14   Linwood DibblesKnapp, Jon, MD  ondansetron (ZOFRAN ODT) 4 MG disintegrating tablet Take 1 tablet (4 mg total) by mouth every 8 (eight) hours as needed for nausea or vomiting. Patient not taking: Reported on 03/03/2017 03/21/14   Ward, Layla MawKristen N, DO  ondansetron (ZOFRAN) 4 MG tablet Take 1 tablet (4 mg total) by mouth every 6 (six) hours. Patient not taking: Reported on 03/03/2017 01/16/14   Linwood DibblesKnapp, Jon, MD  oseltamivir (TAMIFLU) 75 MG capsule Take 1 capsule (75 mg total) by mouth every 12 (twelve) hours. Patient not taking: Reported on 03/03/2017 03/21/14   Ward, Layla MawKristen N, DO  QUEtiapine (SEROQUEL) 400 MG tablet Take 400 mg by mouth at bedtime.    [provider]  simvastatin (ZOCOR) 10  MG tablet Take 10 mg by mouth at bedtime. 09/03/15   [provider]    Family History History reviewed. No pertinent family history.  Social History Social History   Tobacco Use  . Smoking status: Never Smoker  . Smokeless tobacco: Never Used  Substance Use Topics  . Alcohol use: No  . Drug use: No     Allergies   Lactose intolerance (gi) and Penicillins   Review of Systems Review of Systems  Musculoskeletal:       Left toe pain  All other systems reviewed and are negative.    Physical Exam Updated Vital Signs BP (!) 148/86 (BP Location: Left Arm)   Pulse 93   Temp 98.5 F (36.9 C) (Oral)   Resp 16   Ht  5\' 8"  (1.727 m)   Wt 97.5 kg (215 lb)   SpO2 98%   BMI 32.69 kg/m   Physical Exam  Constitutional: He appears well-developed and well-nourished. No distress.  HENT:  Head: Normocephalic.  Eyes: EOM are normal.  Neck: Neck supple.  Cardiovascular: Normal rate.  Pulmonary/Chest: Effort normal.  Musculoskeletal:       Left foot: There is decreased range of motion and tenderness.  Left great toe with subungual hematoma. The nail is loose at the distal aspect but attached at the base. Dried blood noted at the distal aspect of the toe just under the nail.  Neurological: He is alert.  Skin: Skin is warm and dry.  Psychiatric: He has a normal mood and affect.  Nursing note and vitals reviewed.    ED Treatments / Results  Labs (all labs ordered are listed, but only abnormal results are displayed) Labs Reviewed  CBG MONITORING, ED  Radiology Dg Toe Great Left  Result Date: 08/28/2017 CLINICAL DATA:  Pain after trauma EXAM: LEFT GREAT TOE COMPARISON:  None. FINDINGS: There is no evidence of fracture or dislocation. There is no evidence of arthropathy or other focal bone abnormality. Soft tissues are unremarkable. IMPRESSION: Negative. Electronically Signed   By: Gerome Sam III M.D   On: 08/28/2017 21:29    Procedures Procedures (including critical care time)  Medications Ordered in ED Medications  Tdap (BOOSTRIX) injection 0.5 mL (has no administration in time range)  bacitracin ointment (has no administration in time range)     Initial Impression / Assessment and Plan / ED Course  I have reviewed the triage vital signs and the nursing notes. 52 y.o. male here with pain and loose nail on the left great toe stable for d/c without fracture or dislocation noted on x-ray. No signs of infection. Soaked toe in NSS, bacitracin ointment and dressing applied. Buddy taped and post op shoe. Patient to follow up with his PCP in a few days for recheck. I discussed this case with Dr.  Ranae Palms.   Final Clinical Impressions(s) / ED Diagnoses   Final diagnoses:  Subungual hematoma of toe of left foot, initial encounter    ED Discharge Orders    None       Kerrie Buffalo [place], [place] 08/28/17 2202    Loren Racer, MD 09/01/17 1415

## 2017-11-13 ENCOUNTER — Emergency Department (HOSPITAL_COMMUNITY): Payer: No Typology Code available for payment source

## 2017-11-13 ENCOUNTER — Emergency Department (HOSPITAL_COMMUNITY)
Admission: EM | Admit: 2017-11-13 | Discharge: 2017-11-13 | Disposition: A | Discharge status: Home / Self Care | Payer: No Typology Code available for payment source | Attending: Emergency Medicine | Admitting: Emergency Medicine

## 2017-11-13 ENCOUNTER — Encounter (HOSPITAL_COMMUNITY): Payer: Self-pay

## 2017-11-13 DIAGNOSIS — Z7984 Long term (current) use of oral hypoglycemic drugs: Secondary | ICD-10-CM | POA: Diagnosis not present

## 2017-11-13 DIAGNOSIS — R51 Headache: Secondary | ICD-10-CM | POA: Insufficient documentation

## 2017-11-13 DIAGNOSIS — Z79899 Other long term (current) drug therapy: Secondary | ICD-10-CM | POA: Insufficient documentation

## 2017-11-13 DIAGNOSIS — I10 Essential (primary) hypertension: Secondary | ICD-10-CM | POA: Diagnosis not present

## 2017-11-13 DIAGNOSIS — E119 Type 2 diabetes mellitus without complications: Secondary | ICD-10-CM | POA: Insufficient documentation
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
## 2017-11-13 MED ORDER — OXYCODONE-ACETAMINOPHEN 5-325 MG PO TABS
1.0000 | ORAL_TABLET | Freq: Once | ORAL | Status: AC
  Administered 2017-11-13: 1 via ORAL
  Filled 2017-11-13: qty 1

## 2017-11-13 MED ORDER — IBUPROFEN 600 MG PO TABS: 600.0000 mg | ORAL_TABLET | Freq: Three times a day (TID) | ORAL | 0 refills | Status: DC | PRN

## 2017-11-13 MED ORDER — ONDANSETRON 4 MG PO TBDP
8.0000 mg | ORAL_TABLET | Freq: Once | ORAL | Status: AC
  Administered 2017-11-13: 8 mg via ORAL
  Filled 2017-11-13: qty 2

## 2017-11-13 MED ORDER — CYCLOBENZAPRINE HCL 10 MG PO TABS: 10.0000 mg | ORAL_TABLET | Freq: Three times a day (TID) | ORAL | 0 refills | Status: DC | PRN
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
## 2017-11-14 ENCOUNTER — Emergency Department (HOSPITAL_COMMUNITY): Payer: Medicaid Other

## 2017-11-14 ENCOUNTER — Other Ambulatory Visit: Payer: Self-pay

## 2017-11-14 ENCOUNTER — Emergency Department (HOSPITAL_COMMUNITY)
Admission: EM | Admit: 2017-11-14 | Discharge: 2017-11-14 | Disposition: A | Discharge status: Home / Self Care | Payer: Medicaid Other | Attending: Emergency Medicine | Admitting: Emergency Medicine

## 2017-11-14 ENCOUNTER — Encounter (HOSPITAL_COMMUNITY): Payer: Self-pay | Admitting: Emergency Medicine

## 2017-11-14 DIAGNOSIS — Z79899 Other long term (current) drug therapy: Secondary | ICD-10-CM | POA: Insufficient documentation

## 2017-11-14 DIAGNOSIS — Y92410 Unspecified street and highway as the place of occurrence of the external cause: Secondary | ICD-10-CM | POA: Diagnosis not present

## 2017-11-14 DIAGNOSIS — Z7984 Long term (current) use of oral hypoglycemic drugs: Secondary | ICD-10-CM | POA: Diagnosis not present

## 2017-11-14 DIAGNOSIS — Y9389 Activity, other specified: Secondary | ICD-10-CM | POA: Diagnosis not present

## 2017-11-14 DIAGNOSIS — S39012A Strain of muscle, fascia and tendon of lower back, initial encounter: Secondary | ICD-10-CM | POA: Diagnosis not present

## 2017-11-14 DIAGNOSIS — E119 Type 2 diabetes mellitus without complications: Secondary | ICD-10-CM | POA: Insufficient documentation

## 2017-11-14 DIAGNOSIS — S3992XA Unspecified injury of lower back, initial encounter: Secondary | ICD-10-CM | POA: Diagnosis present

## 2017-11-14 DIAGNOSIS — Y999 Unspecified external cause status: Secondary | ICD-10-CM | POA: Insufficient documentation

## 2017-11-14 DIAGNOSIS — M5137 Other intervertebral disc degeneration, lumbosacral region: Secondary | ICD-10-CM | POA: Insufficient documentation

## 2017-11-14 DIAGNOSIS — I10 Essential (primary) hypertension: Secondary | ICD-10-CM | POA: Diagnosis not present

## 2017-11-14 DIAGNOSIS — M5136 Other intervertebral disc degeneration, lumbar region: Secondary | ICD-10-CM
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
## 2017-11-14 MED ORDER — ONDANSETRON HCL 4 MG PO TABS
4.0000 mg | ORAL_TABLET | Freq: Once | ORAL | Status: AC
  Administered 2017-11-14: 4 mg via ORAL
  Filled 2017-11-14: qty 1

## 2017-11-14 MED ORDER — LIDOCAINE 5 % EX PTCH: 1.0000 | MEDICATED_PATCH | CUTANEOUS | 0 refills | Status: DC

## 2017-11-14 MED ORDER — MELOXICAM 7.5 MG PO TABS: 7.5000 mg | ORAL_TABLET | Freq: Every day | ORAL | 0 refills | Status: AC

## 2017-11-14 MED ORDER — LIDOCAINE 5 % EX PTCH
1.0000 | MEDICATED_PATCH | CUTANEOUS | Status: DC
  Administered 2017-11-14: 1 via TRANSDERMAL
  Filled 2017-11-14: qty 1
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
## 2017-11-14 NOTE — Discharge Instructions (Addendum)
Follow-up with your primary care provider for further management and possible referral to physical therapy. Discontinue the Flexeril and Motrin given at yesterday's ER visit. Derm patches to left lower back. Take meloxicam daily as prescribed with food.

## 2017-11-21 ENCOUNTER — Emergency Department (HOSPITAL_COMMUNITY)
Admission: EM | Admit: 2017-11-21 | Discharge: 2017-11-21 | Disposition: A | Discharge status: Home / Self Care | Payer: Medicaid Other | Attending: Emergency Medicine | Admitting: Emergency Medicine

## 2017-11-21 ENCOUNTER — Emergency Department (HOSPITAL_COMMUNITY): Payer: Medicaid Other

## 2017-11-21 ENCOUNTER — Other Ambulatory Visit: Payer: Self-pay

## 2017-11-21 ENCOUNTER — Encounter (HOSPITAL_COMMUNITY): Payer: Self-pay | Admitting: Emergency Medicine

## 2017-11-21 DIAGNOSIS — M549 Dorsalgia, unspecified: Secondary | ICD-10-CM | POA: Diagnosis not present

## 2017-11-21 DIAGNOSIS — Z79899 Other long term (current) drug therapy: Secondary | ICD-10-CM | POA: Diagnosis not present

## 2017-11-21 DIAGNOSIS — I10 Essential (primary) hypertension: Secondary | ICD-10-CM | POA: Insufficient documentation

## 2017-11-21 DIAGNOSIS — G8929 Other chronic pain: Secondary | ICD-10-CM

## 2017-11-21 DIAGNOSIS — Z7984 Long term (current) use of oral hypoglycemic drugs: Secondary | ICD-10-CM | POA: Insufficient documentation

## 2017-11-21 DIAGNOSIS — E119 Type 2 diabetes mellitus without complications: Secondary | ICD-10-CM | POA: Insufficient documentation

## 2017-11-21 HISTORY — DX: Dorsalgia, unspecified: M54.9
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
## 2017-11-21 MED ORDER — HYDROCODONE-ACETAMINOPHEN 5-325 MG PO TABS
1.0000 | ORAL_TABLET | Freq: Once | ORAL | Status: AC
  Administered 2017-11-21: 1 via ORAL
  Filled 2017-11-21: qty 1

## 2017-11-21 MED ORDER — METHOCARBAMOL 500 MG PO TABS: 500.0000 mg | ORAL_TABLET | Freq: Two times a day (BID) | ORAL | 0 refills | Status: DC

## 2017-11-21 NOTE — Discharge Instructions (Signed)
As we discussed, you can continue taking Tylenol and ibuprofen for your pain. You can take Tylenol or Ibuprofen as directed for pain. You can alternate Tylenol and Ibuprofen every 4 hours. If you take Tylenol at 1pm, then you can take Ibuprofen at 5pm. Then you can take Tylenol again at 9pm.   Take Robaxin as prescribed. This medication will make you drowsy so do not drive or drink alcohol when taking it.  You can try warm compresses or heat to help with muscle strain.  As we discussed, please follow-up with your primary care doctor.  Return to the Emergency Department immediately for any worsening back pain, neck pain, difficulty walking, numbness/weaknss of your arms or legs, urinary or bowel accidents, fever or any other worsening or concerning symptoms.

## 2017-11-21 NOTE — ED Notes (Signed)
Patient and significant other at bedside reporting to nurse they are upset about being placed in a hallway bed and that they are unable to rest. Report they are becoming agitated due to surrounding noise. Requesting update on results. PA notified and states will speak with patient.
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
## 2017-11-21 NOTE — ED Notes (Signed)
Patient transported to X-ray 
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
## 2017-11-21 NOTE — ED Notes (Signed)
Patient verbalizes understanding of discharge instructions. Opportunity for questioning and answers were provided. Armband removed by staff, pt discharged from ED.  

## 2017-12-24 ENCOUNTER — Encounter (HOSPITAL_COMMUNITY): Payer: Self-pay | Admitting: *Deleted

## 2017-12-24 ENCOUNTER — Emergency Department (HOSPITAL_COMMUNITY)
Admission: EM | Admit: 2017-12-24 | Discharge: 2017-12-24 | Disposition: A | Discharge status: Home / Self Care | Payer: Medicaid Other | Attending: Emergency Medicine | Admitting: Emergency Medicine

## 2017-12-24 DIAGNOSIS — F319 Bipolar disorder, unspecified: Secondary | ICD-10-CM | POA: Diagnosis not present

## 2017-12-24 DIAGNOSIS — Z7984 Long term (current) use of oral hypoglycemic drugs: Secondary | ICD-10-CM | POA: Insufficient documentation

## 2017-12-24 DIAGNOSIS — J329 Chronic sinusitis, unspecified: Secondary | ICD-10-CM | POA: Diagnosis not present

## 2017-12-24 DIAGNOSIS — Z79899 Other long term (current) drug therapy: Secondary | ICD-10-CM | POA: Insufficient documentation

## 2017-12-24 DIAGNOSIS — I10 Essential (primary) hypertension: Secondary | ICD-10-CM | POA: Insufficient documentation

## 2017-12-24 DIAGNOSIS — E119 Type 2 diabetes mellitus without complications: Secondary | ICD-10-CM | POA: Insufficient documentation

## 2017-12-24 DIAGNOSIS — R070 Pain in throat: Secondary | ICD-10-CM

## 2017-12-24 DIAGNOSIS — J029 Acute pharyngitis, unspecified: Secondary | ICD-10-CM | POA: Diagnosis present

## 2017-12-24 LAB — CBG MONITORING, ED: Glucose-Capillary: 93 mg/dL (ref 70–99)

## 2017-12-24 MED ORDER — AZITHROMYCIN 250 MG PO TABS: 250.0000 mg | ORAL_TABLET | Freq: Every day | ORAL | 0 refills | Status: DC

## 2017-12-24 NOTE — ED Notes (Signed)
CBG 93 
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
## 2017-12-24 NOTE — ED Notes (Signed)
Patient able to ambulate independently  

## 2017-12-24 NOTE — Discharge Instructions (Signed)
Start taking one of your allergy pills regularly.  Use saline spray in your nose and make sure you are drinking plenty of fluids.
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
## 2018-05-26 ENCOUNTER — Emergency Department (HOSPITAL_COMMUNITY)
Admission: EM | Admit: 2018-05-26 | Discharge: 2018-05-27 | Disposition: A | Discharge status: Home / Self Care | Payer: Medicaid Other | Attending: Emergency Medicine | Admitting: Emergency Medicine

## 2018-05-26 ENCOUNTER — Encounter (HOSPITAL_COMMUNITY): Payer: Self-pay

## 2018-05-26 ENCOUNTER — Other Ambulatory Visit: Payer: Self-pay

## 2018-05-26 DIAGNOSIS — Z79899 Other long term (current) drug therapy: Secondary | ICD-10-CM | POA: Diagnosis not present

## 2018-05-26 DIAGNOSIS — Z7984 Long term (current) use of oral hypoglycemic drugs: Secondary | ICD-10-CM | POA: Diagnosis not present

## 2018-05-26 DIAGNOSIS — E119 Type 2 diabetes mellitus without complications: Secondary | ICD-10-CM | POA: Insufficient documentation

## 2018-05-26 DIAGNOSIS — I10 Essential (primary) hypertension: Secondary | ICD-10-CM | POA: Insufficient documentation

## 2018-05-26 DIAGNOSIS — F319 Bipolar disorder, unspecified: Secondary | ICD-10-CM | POA: Diagnosis not present

## 2018-05-26 DIAGNOSIS — R2241 Localized swelling, mass and lump, right lower limb: Secondary | ICD-10-CM | POA: Diagnosis present

## 2018-05-26 DIAGNOSIS — M7989 Other specified soft tissue disorders: Secondary | ICD-10-CM

## 2018-05-26 NOTE — ED Triage Notes (Signed)
Pt reports right leg pain for the past 4 weeks with intermittent swelling around his ankle and calf. Cortisone shot in his knee in December. Pt ambulatory in triage

## 2018-05-27 ENCOUNTER — Ambulatory Visit (HOSPITAL_COMMUNITY)
Admission: RE | Admit: 2018-05-27 | Discharge: 2018-05-27 | Disposition: A | Discharge status: Home / Self Care | Payer: Medicaid Other | Source: Ambulatory Visit | Attending: Vascular Surgery | Admitting: Vascular Surgery

## 2018-05-27 ENCOUNTER — Ambulatory Visit (HOSPITAL_COMMUNITY): Admit: 2018-05-27 | Discharge: 2018-05-27 | Disposition: A | Discharge status: Home / Self Care | Payer: Medicaid Other

## 2018-05-27 DIAGNOSIS — M7989 Other specified soft tissue disorders: Secondary | ICD-10-CM | POA: Diagnosis present

## 2018-05-27 LAB — BASIC METABOLIC PANEL
Anion gap: 8 (ref 5–15)
BUN: 15 mg/dL (ref 6–20)
CO2: 24 mmol/L (ref 22–32)
Calcium: 9 mg/dL (ref 8.9–10.3)
Chloride: 108 mmol/L (ref 98–111)
Creatinine, Ser: 1.5 mg/dL — ABNORMAL HIGH (ref 0.61–1.24)
GFR calc Af Amer: >60 mL/min (ref >60)
GFR calc non Af Amer: 53 mL/min — ABNORMAL LOW (ref >60)
Glucose, Bld: 122 mg/dL — ABNORMAL HIGH (ref 70–99)
Potassium: 4.2 mmol/L (ref 3.5–5.1)
Sodium: 140 mmol/L (ref 135–145)

## 2018-05-27 MED ORDER — ENOXAPARIN SODIUM 100 MG/ML ~~LOC~~ SOLN
100.0000 mg | Freq: Once | SUBCUTANEOUS | Status: AC
  Administered 2018-05-27: 100 mg via SUBCUTANEOUS
  Filled 2018-05-27: qty 1

## 2018-05-27 NOTE — ED Notes (Signed)
Patient verbalizes understanding of discharge instructions. Opportunity for questioning and answers were provided. Armband removed by staff, pt discharged from ED.  

## 2018-05-27 NOTE — ED Provider Notes (Signed)
[facility] Provider Note   CSN: [id number] Arrival date & time: 05/26/18  2239    History   Chief Complaint Chief Complaint  Patient presents with  . Leg Pain    HPI Robert Hooper is a 53 y.o. male.     Patient presents to the emergency department with a chief complaint of 4 weeks of right leg swelling.  He doesn't recall a specific injury, but did have cortisone injections due to knee pain a while back.  He denies any fever, chills, chest pain, or shortness of breath.  Denies any history of PE or DVT.  Denies any recent surgery or immobilization.  Denies any other associated symptoms.  The history is provided by the patient. No language interpreter was used.    Past Medical History:  Diagnosis Date  . Arthritis   . Back pain    chronic  . Bipolar 1 disorder (HCC)   . Depression   . Diabetes mellitus without complication (HCC)   . Hemorrhoids   . Hypertension     Patient Active Problem List   Diagnosis Date Noted  . Internal hemorrhoid, bleeding 09/20/2012    History reviewed. No pertinent surgical history.      Home Medications    Prior to Admission medications   Medication Sig Start Date End Date Taking? Authorizing Provider  ALPRAZolam Prudy Feeler(XANAX) 1 MG tablet Take 1 mg by mouth 4 (four) times daily as needed for anxiety.  08/16/15   [provider]  amphetamine-dextroamphetamine (ADDERALL) 30 MG tablet Take 30 mg by mouth daily.    [provider]  azithromycin (ZITHROMAX) 250 MG tablet Take 1 tablet (250 mg total) by mouth daily. Take first 2 tablets together, then 1 every day until finished. 12/24/17   Gwyneth SproutPlunkett, Whitney, MD  FLUoxetine (PROZAC) 20 MG tablet Take 40 mg by mouth daily. 08/23/15   [provider]  lidocaine (LIDODERM) 5 % Place 1 patch onto the skin daily. Remove & Discard patch within 12 hours or as directed by MD 11/14/17   Jeannie FendMurphy, Laura A, PA-C  lisinopril (PRINIVIL,ZESTRIL) 10  MG tablet Take 10 mg by mouth daily.      [provider]  metFORMIN (GLUCOPHAGE) 500 MG tablet Take 500 mg by mouth 2 (two) times daily with a meal.    [provider]  methocarbamol (ROBAXIN) 500 MG tablet Take 1 tablet (500 mg total) by mouth 2 (two) times daily. 11/21/17   Maxwell CaulLayden, Lindsey A, PA-C  Multiple Vitamin (MULTIVITAMIN WITH MINERALS) TABS Take 1 tablet by mouth daily.    [provider]  QUEtiapine (SEROQUEL) 400 MG tablet Take 400 mg by mouth at bedtime.    [provider]  simvastatin (ZOCOR) 10 MG tablet Take 10 mg by mouth at bedtime. 09/03/15   [provider]    Family History No family history on file.  Social History Social History   Tobacco Use  . Smoking status: Never Smoker  . Smokeless tobacco: Never Used  Substance Use Topics  . Alcohol use: No  . Drug use: No     Allergies   Lactose intolerance (gi) and Penicillins   Review of Systems Review of Systems  All other systems reviewed and are negative.    Physical Exam Updated Vital Signs BP (!) 141/100 (BP Location: Right Arm)   Pulse 80   Temp 98.8 F (37.1 C) (Oral)   Resp 17   Ht 5\' 8"  (1.727 m)   Wt  97.5 kg   SpO2 99%   BMI 32.69 kg/m   Physical Exam Vitals signs and nursing note reviewed.  Constitutional:      Appearance: He is well-developed.  HENT:     Head: Normocephalic and atraumatic.  Eyes:     Conjunctiva/sclera: Conjunctivae normal.  Neck:     Musculoskeletal: Neck supple.  Cardiovascular:     Rate and Rhythm: Normal rate and regular rhythm.     Pulses: Normal pulses.     Heart sounds: No murmur.     Comments: Normal pulses Pulmonary:     Effort: Pulmonary effort is normal. No respiratory distress.     Breath sounds: Normal breath sounds.  Abdominal:     Palpations: Abdomen is soft.     Tenderness: There is no abdominal tenderness.  Musculoskeletal:     Comments: Mild swelling of the right lower extremity, no pitting  edema, no popliteal or calf tenderness, no erythema or evidence of cellulitis or abscess  Skin:    General: Skin is warm and dry.  Neurological:     Mental Status: He is alert.      ED Treatments / Results  Labs (all labs ordered are listed, but only abnormal results are displayed) Labs Reviewed  BASIC METABOLIC PANEL    EKG None  Radiology No results found.  Procedures Procedures (including critical care time)  Medications Ordered in ED Medications - No data to display   Initial Impression / Assessment and Plan / ED Course  I have reviewed the triage vital signs and the nursing notes.  Pertinent labs & imaging results that were available during my care of the patient were reviewed by me and considered in my medical decision making (see chart for details).        Patient with right lower extremity swelling.  No known injury.  No evidence of infection.  Some concern for DVT given unilateral symptoms.  Will give dose of lovenox and order outpatient LE DVT study.  Final Clinical Impressions(s) / ED Diagnoses   Final diagnoses:  Right leg swelling    ED Discharge Orders         Ordered    LE VENOUS     05/27/18 0009           Roxy Horseman, PA-C 05/27/18 0017    Gilda Crease, MD 05/27/18 0425

## 2018-05-27 NOTE — Discharge Instructions (Signed)
Please come back in the morning to have an ultrasound performed on your leg to assess for blood clot.  If the test is normal, you can follow-up with your orthopedic doctor.

## 2018-05-27 NOTE — Progress Notes (Signed)
Lower extremity venous duplex has been completed.   Preliminary results in CV Proc.   Blanch Media 05/27/2018 8:25 AM

## 2018-05-31 ENCOUNTER — Ambulatory Visit (HOSPITAL_COMMUNITY)
Admission: EM | Admit: 2018-05-31 | Discharge: 2018-05-31 | Disposition: A | Discharge status: Home / Self Care | Payer: Medicaid Other | Attending: Family Medicine | Admitting: Family Medicine

## 2018-05-31 ENCOUNTER — Other Ambulatory Visit: Payer: Self-pay

## 2018-05-31 ENCOUNTER — Encounter (HOSPITAL_COMMUNITY): Payer: Self-pay | Admitting: Emergency Medicine

## 2018-05-31 DIAGNOSIS — J039 Acute tonsillitis, unspecified: Secondary | ICD-10-CM | POA: Insufficient documentation

## 2018-05-31 DIAGNOSIS — I10 Essential (primary) hypertension: Secondary | ICD-10-CM

## 2018-05-31 LAB — POCT RAPID STREP A: Streptococcus, Group A Screen (Direct): NEGATIVE

## 2018-05-31 MED ORDER — CEPHALEXIN 500 MG PO CAPS: 500.0000 mg | ORAL_CAPSULE | Freq: Two times a day (BID) | ORAL | 0 refills | Status: DC

## 2018-05-31 NOTE — ED Triage Notes (Signed)
Pt here with sore throat and pain with swallowing

## 2018-05-31 NOTE — ED Provider Notes (Signed)
[facility]    CSN: [id number] Arrival date & time: 05/31/18  1942     History   Chief Complaint Chief Complaint  Patient presents with  . Sore Throat    HPI Robert Hooper is a 53 y.o. male.     HPI\patient's had a sore throat for 2 weeks.  No fever.  No cough runny nose or sinus congestion.  No chest congestion.  No COVID exposure.  He still has tonsils.  Is used not usually prone to tonsillitis.  No one else at home is sick. He states he has well-controlled diabetes and blood pressure.  Past Medical History:  Diagnosis Date  . Arthritis   . Back pain    chronic  . Bipolar 1 disorder (HCC)   . Depression   . Diabetes mellitus without complication (HCC)   . Hemorrhoids   . Hypertension     Patient Active Problem List   Diagnosis Date Noted  . Internal hemorrhoid, bleeding 09/20/2012    History reviewed. No pertinent surgical history.     Home Medications    Prior to Admission medications   Medication Sig Start Date End Date Taking? Authorizing Provider  ALPRAZolam Prudy Feeler(XANAX) 1 MG tablet Take 1 mg by mouth 4 (four) times daily as needed for anxiety.  08/16/15   [provider]  amphetamine-dextroamphetamine (ADDERALL) 30 MG tablet Take 30 mg by mouth daily.    [provider]  azithromycin (ZITHROMAX) 250 MG tablet Take 1 tablet (250 mg total) by mouth daily. Take first 2 tablets together, then 1 every day until finished. 12/24/17   Gwyneth SproutPlunkett, Whitney, MD  cephALEXin (KEFLEX) 500 MG capsule Take 1 capsule (500 mg total) by mouth 2 (two) times daily. 05/31/18   Eustace MooreNelson, Rickell Wiehe Sue, MD  FLUoxetine (PROZAC) 20 MG tablet Take 40 mg by mouth daily. 08/23/15   [provider]  lidocaine (LIDODERM) 5 % Place 1 patch onto the skin daily. Remove & Discard patch within 12 hours or as directed by MD 11/14/17   Jeannie FendMurphy, Laura A, PA-C  lisinopril (PRINIVIL,ZESTRIL) 10 MG tablet Take 10 mg by mouth daily.      [provider]  metFORMIN  (GLUCOPHAGE) 500 MG tablet Take 500 mg by mouth 2 (two) times daily with a meal.    [provider]  methocarbamol (ROBAXIN) 500 MG tablet Take 1 tablet (500 mg total) by mouth 2 (two) times daily. 11/21/17   Maxwell CaulLayden, Lindsey A, PA-C  Multiple Vitamin (MULTIVITAMIN WITH MINERALS) TABS Take 1 tablet by mouth daily.    [provider]  QUEtiapine (SEROQUEL) 400 MG tablet Take 400 mg by mouth at bedtime.    [provider]  simvastatin (ZOCOR) 10 MG tablet Take 10 mg by mouth at bedtime. 09/03/15   [provider]    Family History History reviewed. No pertinent family history.  Social History Social History   Tobacco Use  . Smoking status: Never Smoker  . Smokeless tobacco: Never Used  Substance Use Topics  . Alcohol use: No  . Drug use: No     Allergies   Lactose intolerance (gi) and Penicillins   Review of Systems Review of Systems  Constitutional: Negative for chills and fever.  HENT: Positive for sore throat and trouble swallowing. Negative for ear pain.   Eyes: Negative for pain and visual disturbance.  Respiratory: Negative for cough and shortness of breath.   Cardiovascular: Negative for chest pain and palpitations.  Gastrointestinal: Negative for abdominal pain and  vomiting.  Genitourinary: Negative for dysuria and hematuria.  Musculoskeletal: Negative for arthralgias and back pain.  Skin: Negative for color change and rash.  Neurological: Negative for seizures and syncope.  All other systems reviewed and are negative.    Physical Exam Triage Vital Signs ED Triage Vitals  Enc Vitals Group     BP 05/31/18 1958 (!) 153/99     Pulse Rate 05/31/18 1958 98     Resp 05/31/18 1958 18     Temp 05/31/18 1958 99 F (37.2 C)     Temp Source 05/31/18 1958 Oral     SpO2 05/31/18 1958 95 %     Weight --      Height --      Head Circumference --      Peak Flow --      Pain Score 05/31/18 1959 6     Pain Loc --      Pain Edu? --       Excl. in GC? --    No data found.  Updated Vital Signs BP (!) 153/99 (BP Location: Right Arm)   Pulse 98   Temp 99 F (37.2 C) (Oral)   Resp 18   SpO2 95%   Visual Acuity Right Eye Distance:   Left Eye Distance:   Bilateral Distance:    Right Eye Near:   Left Eye Near:    Bilateral Near:     Physical Exam Constitutional:      General: He is not in acute distress.    Appearance: He is well-developed.  HENT:     Head: Normocephalic and atraumatic.     Right Ear: Tympanic membrane and ear canal normal.     Left Ear: Tympanic membrane normal.     Nose: No congestion or rhinorrhea.     Mouth/Throat:     Mouth: Oral lesions present.     Pharynx: Uvula midline. Oropharyngeal exudate and posterior oropharyngeal erythema present.     Tonsils: Tonsillar exudate present. 2+ on the right. 2+ on the left.     Comments: Yellow exudate over both tonsils and a patch on the uvula.  Appears similar to tonsillitis.  Query diphtheria Eyes:     Conjunctiva/sclera: Conjunctivae normal.     Pupils: Pupils are equal, round, and reactive to light.  Neck:     Musculoskeletal: Normal range of motion.  Cardiovascular:     Rate and Rhythm: Normal rate.  Pulmonary:     Effort: Pulmonary effort is normal. No respiratory distress.  Abdominal:     General: There is no distension.     Palpations: Abdomen is soft.  Musculoskeletal: Normal range of motion.  Skin:    General: Skin is warm and dry.  Neurological:     Mental Status: He is alert.      UC Treatments / Results  Labs (all labs ordered are listed, but only abnormal results are displayed) Labs Reviewed  CULTURE, GROUP A STREP [facility])  POCT RAPID STREP A    EKG None  Radiology No results found.  Procedures Procedures (including critical care time)  Medications Ordered in UC Medications - No data to display  Initial Impression / Assessment and Plan / UC Course  I have reviewed the triage vital signs and the nursing  notes.  Pertinent labs & imaging results that were available during my care of the patient were reviewed by me and considered in my medical decision making (see chart for details).     We  will do a throat culture and send home on antibiotics.  Close follow-up Final Clinical Impressions(s) / UC Diagnoses   Final diagnoses:  Tonsillitis     Discharge Instructions     Take the Keflex 2 times a day for 10 days Continue to drink plenty of fluids Try Chloraseptic spray or lozenges for sore throat pain Hot tea, salt water gargles might help Call your primary care doctor if not improved by next week.  You may need to see an ear nose and throat specialist    ED Prescriptions    Medication Sig Dispense Auth. Provider   cephALEXin (KEFLEX) 500 MG capsule Take 1 capsule (500 mg total) by mouth 2 (two) times daily. 20 capsule Eustace Moore, MD     Controlled Substance Prescriptions [place] Controlled Substance Registry consulted? Not Applicable   Eustace Moore, MD 05/31/18 2059

## 2018-05-31 NOTE — Discharge Instructions (Signed)
Take the Keflex 2 times a day for 10 days Continue to drink plenty of fluids Try Chloraseptic spray or lozenges for sore throat pain Hot tea, salt water gargles might help Call your primary care doctor if not improved by next week.  You may need to see an ear nose and throat specialist

## 2018-06-03 LAB — CULTURE, GROUP A STREP (THRC)

## 2019-01-22 ENCOUNTER — Emergency Department (HOSPITAL_COMMUNITY)
Admission: EM | Admit: 2019-01-22 | Discharge: 2019-01-22 | Disposition: A | Discharge status: Home / Self Care | Payer: Medicaid Other | Attending: Emergency Medicine | Admitting: Emergency Medicine

## 2019-01-22 ENCOUNTER — Other Ambulatory Visit: Payer: Self-pay

## 2019-01-22 ENCOUNTER — Encounter (HOSPITAL_COMMUNITY): Payer: Self-pay | Admitting: *Deleted

## 2019-01-22 DIAGNOSIS — R040 Epistaxis: Secondary | ICD-10-CM | POA: Diagnosis present

## 2019-01-22 DIAGNOSIS — Z7984 Long term (current) use of oral hypoglycemic drugs: Secondary | ICD-10-CM | POA: Insufficient documentation

## 2019-01-22 DIAGNOSIS — I10 Essential (primary) hypertension: Secondary | ICD-10-CM | POA: Diagnosis not present

## 2019-01-22 DIAGNOSIS — Z79899 Other long term (current) drug therapy: Secondary | ICD-10-CM | POA: Diagnosis not present

## 2019-01-22 DIAGNOSIS — E119 Type 2 diabetes mellitus without complications: Secondary | ICD-10-CM | POA: Insufficient documentation

## 2019-01-22 MED ORDER — SILVER NITRATE-POT NITRATE 75-25 % EX MISC
1.0000 "application " | Freq: Once | CUTANEOUS | Status: AC
  Administered 2019-01-22: 1 via TOPICAL
  Filled 2019-01-22: qty 1

## 2019-01-22 MED ORDER — COCAINE HCL 4 % EX SOLN
4.0000 mL | Freq: Once | CUTANEOUS | Status: AC
  Administered 2019-01-22: 4 mL via TOPICAL
  Filled 2019-01-22: qty 4

## 2019-01-22 NOTE — ED Provider Notes (Signed)
[facility] EMERGENCY DEPARTMENT Provider Note   CSN: [id number] Arrival date & time: 01/22/19  0018     History Chief Complaint  Patient presents with  . Epistaxis    Robert Hooper is a 53 y.o. male.  Patient presents to the emergency department for evaluation of nosebleed.  Patient reports that he blew his nose and then had a large amount of bleeding from the left side of his nose earlier tonight.  The bleeding has now stopped.  He reports that he became alarmed because of how much blood there was.  No pain.  No trauma.  He does report a history of similar problems years ago, none since.        Past Medical History:  Diagnosis Date  . Arthritis   . Back pain    chronic  . Bipolar 1 disorder ([place])   . Depression   . Diabetes mellitus without complication ([place])   . Hemorrhoids   . Hypertension     Patient Active Problem List   Diagnosis Date Noted  . Internal hemorrhoid, bleeding 09/20/2012    History reviewed. No pertinent surgical history.     No family history on file.  Social History   Tobacco Use  . Smoking status: Never Smoker  . Smokeless tobacco: Never Used  Substance Use Topics  . Alcohol use: No  . Drug use: No    Home Medications Prior to Admission medications   Medication Sig Start Date End Date Taking? Authorizing Provider  ALPRAZolam Duanne Moron) 1 MG tablet Take 1 mg by mouth 4 (four) times daily as needed for anxiety.  08/16/15   [provider]  amphetamine-dextroamphetamine (ADDERALL) 30 MG tablet Take 30 mg by mouth daily.    [provider]  azithromycin (ZITHROMAX) 250 MG tablet Take 1 tablet (250 mg total) by mouth daily. Take first 2 tablets together, then 1 every day until finished. 12/24/17   Blanchie Dessert, MD  cephALEXin (KEFLEX) 500 MG capsule Take 1 capsule (500 mg total) by mouth 2 (two) times daily. 05/31/18   Raylene Everts, MD  FLUoxetine (PROZAC) 20 MG tablet Take 40 mg by mouth  daily. 08/23/15   [provider]  lidocaine (LIDODERM) 5 % Place 1 patch onto the skin daily. Remove & Discard patch within 12 hours or as directed by MD 11/14/17   Tacy Learn, PA-C  lisinopril (PRINIVIL,ZESTRIL) 10 MG tablet Take 10 mg by mouth daily.      [provider]  metFORMIN (GLUCOPHAGE) 500 MG tablet Take 500 mg by mouth 2 (two) times daily with a meal.    [provider]  methocarbamol (ROBAXIN) 500 MG tablet Take 1 tablet (500 mg total) by mouth 2 (two) times daily. 11/21/17   Volanda Napoleon, PA-C  Multiple Vitamin (MULTIVITAMIN WITH MINERALS) TABS Take 1 tablet by mouth daily.    [provider]  QUEtiapine (SEROQUEL) 400 MG tablet Take 400 mg by mouth at bedtime.    [provider]  simvastatin (ZOCOR) 10 MG tablet Take 10 mg by mouth at bedtime. 09/03/15   [provider]    Allergies    Lactose intolerance (gi) and Penicillins  Review of Systems   Review of Systems  HENT: Positive for nosebleeds.   All other systems reviewed and are negative.   Physical Exam Updated Vital Signs BP 104/79   Pulse 72   Temp 97.9 F (36.6 C) (Oral)   Resp 18   Ht 5'  8" (1.727 m)   Wt 100.7 kg   SpO2 96%   BMI 33.75 kg/m   Physical Exam Vitals and nursing note reviewed.  Constitutional:      General: He is not in acute distress.    Appearance: Normal appearance. He is well-developed.  HENT:     Head: Normocephalic and atraumatic.     Right Ear: Hearing normal.     Left Ear: Hearing normal.     Nose: Nose normal.     Comments: Dilated anterior septal vessel on left, no active bleeding Eyes:     Conjunctiva/sclera: Conjunctivae normal.     Pupils: Pupils are equal, round, and reactive to light.  Cardiovascular:     Rate and Rhythm: Regular rhythm.     Heart sounds: S1 normal and S2 normal. No murmur. No friction rub. No gallop.   Pulmonary:     Effort: Pulmonary effort is normal. No respiratory distress.      Breath sounds: Normal breath sounds.  Chest:     Chest wall: No tenderness.  Abdominal:     General: Bowel sounds are normal.     Palpations: Abdomen is soft.     Tenderness: There is no abdominal tenderness. There is no guarding or rebound. Negative signs include Murphy's sign and McBurney's sign.     Hernia: No hernia is present.  Musculoskeletal:        General: Normal range of motion.     Cervical back: Normal range of motion and neck supple.  Skin:    General: Skin is warm and dry.     Findings: No rash.  Neurological:     Mental Status: He is alert and oriented to person, place, and time.     GCS: GCS eye subscore is 4. GCS verbal subscore is 5. GCS motor subscore is 6.     Cranial Nerves: No cranial nerve deficit.     Sensory: No sensory deficit.     Coordination: Coordination normal.  Psychiatric:        Speech: Speech normal.        Behavior: Behavior normal.        Thought Content: Thought content normal.     ED Results / Procedures / Treatments   Labs (all labs ordered are listed, but only abnormal results are displayed) Labs Reviewed - No data to display  EKG None  Radiology No results found.  Procedures .Epistaxis Management  Date/Time: 01/22/2019 5:21 AM Performed by: Gilda Crease, MD Authorized by: Gilda Crease, MD   Consent:    Consent obtained:  Verbal   Consent given by:  Patient   Risks discussed:  Bleeding, nasal injury and pain Universal protocol:    Procedure explained and questions answered to patient or proxy's satisfaction: yes     Required blood products, implants, devices, and special equipment available: yes     Site/side marked: yes     Time out called: yes     Patient identity confirmed:  Verbally with patient Anesthesia (see MAR for exact dosages):    Anesthesia method:  Topical application   Topical anesthetic:  Cocaine Procedure details:    Treatment site:  L anterior   Treatment method:  Silver nitrate    Treatment complexity:  Limited   Treatment episode: initial   Post-procedure details:    Assessment:  Bleeding stopped   Patient tolerance of procedure:  Tolerated well, no immediate complications   (including critical care time)  Medications Ordered in  ED Medications  cocaine 4 % topical solution 4 mL (4 mLs Topical Given 01/22/19 0334)  silver nitrate applicators applicator 1 application (1 application Topical Given 01/22/19 0451)    ED Course  I have reviewed the triage vital signs and the nursing notes.  Pertinent labs & imaging results that were available during my care of the patient were reviewed by me and considered in my medical decision making (see chart for details).    MDM Rules/Calculators/A&P                      Patient presents to the emergency department for evaluation of nosebleed.  Reports heavy bleeding for approximately 20 minutes, bleeding stopped on arrival to the ER.  Patient does have dilated vessels that were the obvious source of bleeding.  Treated topically with cocaine 4% and then cauterized with silver nitrate.  Patient monitored after procedure and there was no further bleeding.  In addition to the bleeding, patient reports that he has had persistent discomfort on the left side of his throat when swallowing.  Oropharyngeal examination was normal.  Will refer to ENT for further evaluation of this as well as recheck of nosebleed. Final Clinical Impression(s) / ED Diagnoses Final diagnoses:  Left-sided epistaxis    Rx / DC Orders ED Discharge Orders    None       Kaytlan Behrman, Canary Brimhristopher J, MD 01/22/19 [phone]

## 2019-01-22 NOTE — ED Triage Notes (Signed)
Nosebleed for 20 minutes  None at present hx of the same  On no blood thinners

## 2019-02-13 DIAGNOSIS — R040 Epistaxis: Secondary | ICD-10-CM | POA: Insufficient documentation

## 2019-02-13 DIAGNOSIS — J029 Acute pharyngitis, unspecified: Secondary | ICD-10-CM | POA: Insufficient documentation

## 2019-04-13 DIAGNOSIS — K1379 Other lesions of oral mucosa: Secondary | ICD-10-CM | POA: Insufficient documentation

## 2019-06-23 DIAGNOSIS — L100 Pemphigus vulgaris: Secondary | ICD-10-CM | POA: Insufficient documentation

## 2020-01-10 ENCOUNTER — Other Ambulatory Visit: Payer: Self-pay

## 2020-01-10 ENCOUNTER — Emergency Department (HOSPITAL_COMMUNITY)
Admission: EM | Admit: 2020-01-10 | Discharge: 2020-01-10 | Disposition: A | Discharge status: Home / Self Care | Payer: Medicaid Other | Attending: Emergency Medicine | Admitting: Emergency Medicine

## 2020-01-10 ENCOUNTER — Emergency Department (HOSPITAL_COMMUNITY): Payer: Medicaid Other

## 2020-01-10 ENCOUNTER — Encounter (HOSPITAL_COMMUNITY): Payer: Self-pay | Admitting: Emergency Medicine

## 2020-01-10 DIAGNOSIS — M79605 Pain in left leg: Secondary | ICD-10-CM | POA: Diagnosis not present

## 2020-01-10 DIAGNOSIS — Z7984 Long term (current) use of oral hypoglycemic drugs: Secondary | ICD-10-CM | POA: Diagnosis not present

## 2020-01-10 DIAGNOSIS — E119 Type 2 diabetes mellitus without complications: Secondary | ICD-10-CM | POA: Insufficient documentation

## 2020-01-10 DIAGNOSIS — Z79899 Other long term (current) drug therapy: Secondary | ICD-10-CM | POA: Insufficient documentation

## 2020-01-10 DIAGNOSIS — M542 Cervicalgia: Secondary | ICD-10-CM | POA: Insufficient documentation

## 2020-01-10 DIAGNOSIS — R0781 Pleurodynia: Secondary | ICD-10-CM | POA: Insufficient documentation

## 2020-01-10 DIAGNOSIS — Y92410 Unspecified street and highway as the place of occurrence of the external cause: Secondary | ICD-10-CM | POA: Diagnosis not present

## 2020-01-10 DIAGNOSIS — S0990XA Unspecified injury of head, initial encounter: Secondary | ICD-10-CM | POA: Diagnosis present

## 2020-01-10 DIAGNOSIS — I10 Essential (primary) hypertension: Secondary | ICD-10-CM | POA: Diagnosis not present

## 2020-01-10 DIAGNOSIS — M25512 Pain in left shoulder: Secondary | ICD-10-CM | POA: Diagnosis not present
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
## 2020-01-10 MED ORDER — KETOROLAC TROMETHAMINE 30 MG/ML IJ SOLN
30.0000 mg | Freq: Once | INTRAMUSCULAR | Status: AC
  Administered 2020-01-10: 30 mg via INTRAMUSCULAR
  Filled 2020-01-10: qty 1

## 2020-01-10 MED ORDER — TIZANIDINE HCL 2 MG PO CAPS: 2.0000 mg | ORAL_CAPSULE | Freq: Three times a day (TID) | ORAL | 0 refills | Status: DC

## 2020-01-10 MED ORDER — NAPROXEN 500 MG PO TABS: 500.0000 mg | ORAL_TABLET | Freq: Two times a day (BID) | ORAL | 0 refills | Status: AC
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
## 2020-01-10 NOTE — ED Notes (Addendum)
Pt upset about his visit. He plans to seek more medical treatment elsewhere. He stated that he is still in discomfort and thinks something is wrong with his shoulder. Pt did end up leaving before being able to tell his nurse.
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
## 2020-01-10 NOTE — ED Notes (Signed)
Patient transported to CT 

## 2020-01-10 NOTE — ED Notes (Signed)
Patient transported to x-ray. ?
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
## 2020-01-13 ENCOUNTER — Other Ambulatory Visit: Payer: Self-pay

## 2020-01-13 ENCOUNTER — Emergency Department (HOSPITAL_COMMUNITY): Payer: Medicaid Other

## 2020-01-13 ENCOUNTER — Emergency Department (HOSPITAL_COMMUNITY)
Admission: EM | Admit: 2020-01-13 | Discharge: 2020-01-13 | Disposition: A | Discharge status: Home / Self Care | Payer: Medicaid Other | Attending: Emergency Medicine | Admitting: Emergency Medicine

## 2020-01-13 ENCOUNTER — Encounter (HOSPITAL_COMMUNITY): Payer: Self-pay

## 2020-01-13 DIAGNOSIS — R0781 Pleurodynia: Secondary | ICD-10-CM | POA: Diagnosis not present

## 2020-01-13 DIAGNOSIS — E119 Type 2 diabetes mellitus without complications: Secondary | ICD-10-CM | POA: Diagnosis not present

## 2020-01-13 DIAGNOSIS — M25512 Pain in left shoulder: Secondary | ICD-10-CM | POA: Diagnosis present

## 2020-01-13 DIAGNOSIS — M545 Low back pain, unspecified: Secondary | ICD-10-CM | POA: Insufficient documentation

## 2020-01-13 DIAGNOSIS — M542 Cervicalgia: Secondary | ICD-10-CM | POA: Insufficient documentation

## 2020-01-13 DIAGNOSIS — I10 Essential (primary) hypertension: Secondary | ICD-10-CM | POA: Diagnosis not present

## 2020-01-13 DIAGNOSIS — Y92410 Unspecified street and highway as the place of occurrence of the external cause: Secondary | ICD-10-CM | POA: Insufficient documentation

## 2020-01-13 DIAGNOSIS — R519 Headache, unspecified: Secondary | ICD-10-CM | POA: Diagnosis not present

## 2020-01-13 DIAGNOSIS — Z7984 Long term (current) use of oral hypoglycemic drugs: Secondary | ICD-10-CM | POA: Diagnosis not present

## 2020-01-13 DIAGNOSIS — Z79899 Other long term (current) drug therapy: Secondary | ICD-10-CM | POA: Diagnosis not present
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
## 2020-01-13 MED ORDER — KETOROLAC TROMETHAMINE 60 MG/2ML IM SOLN
60.0000 mg | Freq: Once | INTRAMUSCULAR | Status: AC
  Administered 2020-01-13: 60 mg via INTRAMUSCULAR
  Filled 2020-01-13: qty 2

## 2020-01-13 NOTE — Discharge Instructions (Addendum)
Your left shoulder and lumbar x-ray showed no acute abnormalities.  I am placing you in a sling to provide comfort however you must take your arm out of the sling several times a day and perform shoulder exercises which are attached in the instructions. You will need to follow-up with an orthopedic specialist for further evaluation.  I given you referral to Dr. Ranell Patrick. It may be helpful to apply ice to the affected area.  He also may also purchase over-the-counter lidocaine patches.  You may continue to use your prescribed narcotic pain medications for pain relief as well as the prescribed naproxen and Zanaflex tablets given at the last visit. Return to the emergency department for these reasons, otherwise follow-up with your primary care physician. Get help right away if: You have: Numbness, tingling, or weakness in your extremities. Loss  of  bowel or bladder control. Shortness of breath  New onset chest pain Blood in your urine, stool, or vomit. Severe pain in your abdomen. Sudden vision loss or double vision. Your eye suddenly becomes red. Your pupil is an odd shape or size.
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
## 2020-01-13 NOTE — ED Notes (Signed)
Patient back in room from scan
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
## 2020-01-13 NOTE — ED Notes (Signed)
This RN reviewed the results of scans completed with pt, follow-up care instructions, and pain management. Pt verbalized understanding. Pt refused to sign in dispo tab

## 2020-01-17 ENCOUNTER — Other Ambulatory Visit: Payer: Self-pay

## 2020-01-17 ENCOUNTER — Encounter (HOSPITAL_COMMUNITY): Payer: Self-pay

## 2020-01-17 ENCOUNTER — Emergency Department (HOSPITAL_COMMUNITY): Payer: Medicaid Other

## 2020-01-17 ENCOUNTER — Emergency Department (HOSPITAL_COMMUNITY)
Admission: EM | Admit: 2020-01-17 | Discharge: 2020-01-17 | Discharge status: Against Medical Advice | Payer: Medicaid Other | Attending: Emergency Medicine | Admitting: Emergency Medicine

## 2020-01-17 ENCOUNTER — Emergency Department (HOSPITAL_COMMUNITY)
Admission: EM | Admit: 2020-01-17 | Discharge: 2020-01-17 | Disposition: A | Discharge status: Home / Self Care | Payer: Medicaid Other | Source: Home / Self Care | Attending: Emergency Medicine | Admitting: Emergency Medicine

## 2020-01-17 DIAGNOSIS — M25512 Pain in left shoulder: Secondary | ICD-10-CM | POA: Insufficient documentation

## 2020-01-17 DIAGNOSIS — M791 Myalgia, unspecified site: Secondary | ICD-10-CM | POA: Diagnosis present

## 2020-01-17 DIAGNOSIS — I10 Essential (primary) hypertension: Secondary | ICD-10-CM | POA: Insufficient documentation

## 2020-01-17 DIAGNOSIS — M79622 Pain in left upper arm: Secondary | ICD-10-CM | POA: Insufficient documentation

## 2020-01-17 DIAGNOSIS — E119 Type 2 diabetes mellitus without complications: Secondary | ICD-10-CM | POA: Insufficient documentation

## 2020-01-17 DIAGNOSIS — Y92410 Unspecified street and highway as the place of occurrence of the external cause: Secondary | ICD-10-CM | POA: Insufficient documentation

## 2020-01-17 DIAGNOSIS — Z79899 Other long term (current) drug therapy: Secondary | ICD-10-CM | POA: Insufficient documentation

## 2020-01-17 DIAGNOSIS — R52 Pain, unspecified: Secondary | ICD-10-CM

## 2020-01-17 DIAGNOSIS — Z7984 Long term (current) use of oral hypoglycemic drugs: Secondary | ICD-10-CM | POA: Insufficient documentation

## 2020-01-17 DIAGNOSIS — R252 Cramp and spasm: Secondary | ICD-10-CM | POA: Diagnosis not present

## 2020-01-17 DIAGNOSIS — M545 Low back pain, unspecified: Secondary | ICD-10-CM | POA: Insufficient documentation
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
## 2020-01-17 MED ORDER — KETOROLAC TROMETHAMINE 15 MG/ML IJ SOLN
15.0000 mg | Freq: Once | INTRAMUSCULAR | Status: AC
  Administered 2020-01-17: 15 mg via INTRAMUSCULAR
  Filled 2020-01-17: qty 1

## 2020-01-17 MED ORDER — GABAPENTIN 100 MG PO CAPS: 100.0000 mg | ORAL_CAPSULE | Freq: Three times a day (TID) | ORAL | 0 refills | Status: DC

## 2020-01-17 MED ORDER — TIZANIDINE HCL 4 MG PO TABS
2.0000 mg | ORAL_TABLET | Freq: Once | ORAL | Status: AC
  Administered 2020-01-17: 2 mg via ORAL
  Filled 2020-01-17: qty 1
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
## 2020-01-17 NOTE — ED Notes (Signed)
Patient transported to X-ray 
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
## 2020-01-23 ENCOUNTER — Other Ambulatory Visit: Payer: Self-pay

## 2020-01-23 ENCOUNTER — Ambulatory Visit (INDEPENDENT_AMBULATORY_CARE_PROVIDER_SITE_OTHER): Payer: Medicaid Other | Admitting: Family Medicine

## 2020-01-23 DIAGNOSIS — M25512 Pain in left shoulder: Secondary | ICD-10-CM

## 2020-01-23 DIAGNOSIS — R0781 Pleurodynia: Secondary | ICD-10-CM

## 2020-01-23 DIAGNOSIS — M5442 Lumbago with sciatica, left side: Secondary | ICD-10-CM

## 2020-01-23 MED ORDER — NABUMETONE 750 MG PO TABS: 750.0000 mg | ORAL_TABLET | Freq: Two times a day (BID) | ORAL | 6 refills | Status: DC | PRN

## 2020-01-23 MED ORDER — BACLOFEN 10 MG PO TABS: 5.0000 mg | ORAL_TABLET | Freq: Three times a day (TID) | ORAL | 3 refills | Status: DC | PRN
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
## 2020-02-13 ENCOUNTER — Other Ambulatory Visit: Payer: Self-pay

## 2020-02-13 ENCOUNTER — Ambulatory Visit: Payer: Medicaid Other | Attending: Family Medicine

## 2020-02-13 DIAGNOSIS — M5442 Lumbago with sciatica, left side: Secondary | ICD-10-CM

## 2020-02-13 DIAGNOSIS — M25512 Pain in left shoulder: Secondary | ICD-10-CM | POA: Diagnosis not present

## 2020-02-13 DIAGNOSIS — G8911 Acute pain due to trauma: Secondary | ICD-10-CM | POA: Diagnosis present

## 2020-02-13 DIAGNOSIS — M25612 Stiffness of left shoulder, not elsewhere classified: Secondary | ICD-10-CM | POA: Diagnosis present

## 2020-02-13 DIAGNOSIS — M6281 Muscle weakness (generalized): Secondary | ICD-10-CM | POA: Diagnosis present

## 2020-02-13 DIAGNOSIS — R0781 Pleurodynia: Secondary | ICD-10-CM | POA: Diagnosis present

## 2020-02-13 DIAGNOSIS — R0789 Other chest pain: Secondary | ICD-10-CM
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
## 2020-02-14 ENCOUNTER — Ambulatory Visit: Payer: No Typology Code available for payment source | Admitting: Physical Therapy

## 2020-02-20 ENCOUNTER — Other Ambulatory Visit: Payer: Self-pay

## 2020-02-20 ENCOUNTER — Ambulatory Visit: Payer: Medicaid Other

## 2020-02-20 DIAGNOSIS — M25612 Stiffness of left shoulder, not elsewhere classified: Secondary | ICD-10-CM

## 2020-02-20 DIAGNOSIS — M6281 Muscle weakness (generalized): Secondary | ICD-10-CM

## 2020-02-20 DIAGNOSIS — M5442 Lumbago with sciatica, left side: Secondary | ICD-10-CM

## 2020-02-20 DIAGNOSIS — G8911 Acute pain due to trauma: Secondary | ICD-10-CM

## 2020-02-20 DIAGNOSIS — R0781 Pleurodynia: Secondary | ICD-10-CM

## 2020-02-20 DIAGNOSIS — M25512 Pain in left shoulder: Secondary | ICD-10-CM | POA: Diagnosis not present

## 2020-02-20 NOTE — Therapy (Signed)
[facility] Outpatient Rehabilitation [facility] [address] [place], [place], [address] Phone: [phone]   Fax:  [phone]  Physical Therapy Treatment  Patient Details  Name: Robert Hooper MRN: [id number] Date of Birth: August 25, 1965 Referring Provider (PT): Lavada Mesi, MD   Encounter Date: 02/20/2020   PT End of Session - 02/20/20 1536    Visit Number 2    Number of Visits 13    Date for PT Re-Evaluation 04/02/20    Authorization Type MED PAY ASSURANCE; MEDICAID [place] ACCESS    Progress Note Due on Visit 10    PT Start Time 1534    PT Stop Time 1630    PT Time Calculation (min) 56 min    Activity Tolerance Patient tolerated treatment well;Patient limited by pain    Behavior During Therapy [facility] for tasks assessed/performed           Past Medical History:  Diagnosis Date  . Arthritis   . Back pain    chronic  . Bipolar 1 disorder (HCC)   . Depression   . Diabetes mellitus without complication (HCC)   . Hemorrhoids   . Hypertension     History reviewed. No pertinent surgical history.  There were no vitals filed for this visit.   Subjective Assessment - 02/20/20 1540    Subjective L shoulder has had a combination of better and not so good days. Generally, slowly getting better.    Limitations Sitting;Lifting;Other (comment);House hold activities    Diagnostic tests 01/17/20 L shoulder Xray:FINDINGS:  There is no acute fracture or dislocation. There is mild osteopenia  with mild arthritic changes and spurring. The soft tissues are  unremarkable.  IMPRESSION:  No acute fracture or dislocation. Lumbar Xray: FINDINGS:  No fracture or spondylolisthesis is noted. Moderate degenerative  disc disease is noted at L5-S1 with anterior osteophyte formation.  Mild degenerative disc disease is noted at L4-5.     IMPRESSION:  Mild to moderate multilevel degenerative disc disease. No acute  abnormality is noted.    Patient Stated Goals For pain to get better, to  have good use of L shoulder and back, and to be able to return to work.    Currently in Pain? Yes    Pain Score 8     Pain Location Shoulder    Pain Orientation Left    Pain Descriptors / Indicators Throbbing;Sharp;Aching    Pain Type Acute pain    Pain Onset More than a month ago    Pain Frequency Intermittent    Aggravating Factors  sitting, sleepin, and certain movements    Pain Relieving Factors meds, Tiger balm analgesic                             [facility] Adult PT Treatment/Exercise - 02/20/20 0001      Exercises   Exercises Shoulder;Neck      Shoulder Exercises: Standing   Flexion AAROM;Left;15 reps    Flexion Limitations on table top    Extension Both;12 reps    Theraband Level (Shoulder Extension) Level 2 (Red)    Row Both;12 reps    Theraband Level (Shoulder Row) Level 2 (Red)      Shoulder Exercises: Pulleys   Flexion 2 minutes      Modalities   Modalities Cryotherapy      Cryotherapy   Number Minutes Cryotherapy 15 Minutes    Cryotherapy Location Shoulder    Type of Cryotherapy Ice pack  Manual Therapy   Manual Therapy Joint mobilization    Joint Mobilization grade 2 PA and inf. glides for l shoulder flexion ROM      Neck Exercises: Stretches   Upper Trapezius Stretch Left;2 reps    Upper Trapezius Stretch Limitations 15 sec    Levator Stretch Left;1 rep;2 reps    Levator Stretch Limitations 15 sec                    PT Short Term Goals - 02/13/20 2231      PT SHORT TERM GOAL #1   Title Pt will be Ind in an initial HEP    Baseline Started on eval    Status New    Target Date 03/05/20      PT SHORT TERM GOAL #2   Title Pt will voice understanding of measures to assist with the reduction of L shoulder pain             PT Long Term Goals - 02/13/20 2233      PT LONG TERM GOAL #1   Title Increase L shoulder strength to 4+/5 for improved 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    Status New    Target Date 04/05/20      PT LONG TERM GOAL #3   Title Improve L shoulder pain to 4/10 or less for ADLs and work related activities    Baseline 10/10    Status New    Target Date 04/05/20      PT LONG TERM GOAL #4   Title Pt will be Ind in a final HEP to maintain or progress achieved LOF.    Status New    Target Date 04/05/20                 Plan - 02/20/20 2230    Clinical Impression Statement PT was completed for L peri-scapular strengthening, L upper trap and levator scapulae flexibility, and AAROM for the L shoulder. AAROM for shoulder flexion on table top = 95d. A cold pack was applied for 15 mins at the end of session for pain and swelling prevention.    Personal Factors and Comorbidities Comorbidity 1;Comorbidity 2;Comorbidity 3+    Comorbidities DM, HTN, arthritis, bipolar depression    Examination-Activity Limitations Bathing;Carry;Dressing;Hygiene/Grooming;Toileting;Reach Overhead    Examination-Participation Restrictions Occupation    Stability/Clinical Decision Making Evolving/Moderate complexity    Clinical Decision Making Moderate    Rehab Potential Good    PT Frequency 2x / week    PT Duration 6 weeks    PT Treatment/Interventions ADLs/Self Care Home Management;Cryotherapy;Electrical Stimulation;Moist Heat;Iontophoresis 4mg /ml Dexamethasone;Therapeutic activities;Therapeutic exercise;Manual techniques;Patient/family education;Passive range of motion;Dry needling;Taping;Joint Manipulations    PT Next Visit Plan Pt is to wear a buttoned shirt the next PT session for better visual inspection of the L shoulder. Progress ther ex for ROM and strength, and utilize manual care mobilities as indicated. Eval L low back and ribs as indicated.     PT Home Exercise Plan [email or web address]    Consulted and Agree with Plan of Care Patient           Patient  will benefit from skilled therapeutic intervention in order to improve the following deficits and impairments:  Decreased mobility,Decreased strength,Pain,Impaired UE functional use  Visit Diagnosis: Acute pain of left shoulder due to trauma  Decreased ROM of left shoulder  Muscle weakness (generalized)  Rib pain on left side  Acute left-sided low back pain with left-sided sciatica     Problem List Patient Active Problem List   Diagnosis Date Noted  . Internal hemorrhoid, bleeding 09/20/2012   Joellyn Rued MS, PT 02/20/20 10:57 PM  [facility] Outpatient Rehabilitation [facility] [address] [place], [place], [address] Phone: [phone]   Fax:  [phone]  Name: Robert Hooper MRN: [id number] Date of Birth: 1965-10-05

## 2020-03-05 ENCOUNTER — Other Ambulatory Visit: Payer: Self-pay

## 2020-03-05 ENCOUNTER — Ambulatory Visit: Payer: Medicaid Other | Attending: Family Medicine | Admitting: Physical Therapy

## 2020-03-05 ENCOUNTER — Encounter: Payer: Self-pay | Admitting: Physical Therapy

## 2020-03-05 DIAGNOSIS — M25512 Pain in left shoulder: Secondary | ICD-10-CM | POA: Diagnosis not present

## 2020-03-05 DIAGNOSIS — M6281 Muscle weakness (generalized): Secondary | ICD-10-CM | POA: Diagnosis present

## 2020-03-05 DIAGNOSIS — M25612 Stiffness of left shoulder, not elsewhere classified: Secondary | ICD-10-CM | POA: Insufficient documentation

## 2020-03-05 DIAGNOSIS — R0781 Pleurodynia: Secondary | ICD-10-CM | POA: Diagnosis present

## 2020-03-05 DIAGNOSIS — M5442 Lumbago with sciatica, left side: Secondary | ICD-10-CM | POA: Insufficient documentation

## 2020-03-05 DIAGNOSIS — G8911 Acute pain due to trauma: Secondary | ICD-10-CM | POA: Insufficient documentation

## 2020-03-05 NOTE — Therapy (Signed)
[facility] Outpatient Rehabilitation [facility] [address] [place], [place], [address] Phone: [phone]   Fax:  [phone]  Physical Therapy Treatment  Patient Details  Name: Robert Hooper MRN: [id number] Date of Birth: 1965/07/29 Referring Provider (PT): Lavada Mesi, MD   Encounter Date: 03/05/2020   PT End of Session - 03/05/20 1039    Visit Number 3    Number of Visits 13    Date for PT Re-Evaluation 04/02/20    Authorization Type MCD    Authorization Time Period 03/05/20 - 03/25/20    Authorization - Number of Visits 1    Progress Note Due on Visit 3    PT Start Time 1045    PT Stop Time 1130    PT Time Calculation (min) 45 min    Activity Tolerance Patient tolerated treatment well    Behavior During Therapy [facility] for tasks assessed/performed           Past Medical History:  Diagnosis Date  . Arthritis   . Back pain    chronic  . Bipolar 1 disorder (HCC)   . Depression   . Diabetes mellitus without complication (HCC)   . Hemorrhoids   . Hypertension     History reviewed. No pertinent surgical history.  There were no vitals filed for this visit.   Subjective Assessment - 03/05/20 1044    Subjective Left shoulder and neck still bothering him. Lower back is still hurting. States he has his days, still having popping in the shoulder. Patient reports in the morning he will have pain in his lower back, especially after he has been bending forward and then goes to straighten up. Pain is across both sides.    Patient Stated Goals For pain to get better, to have good use of L shoulder and back, and to be able to return to work.    Currently in Pain? Yes    Pain Score 9     Pain Location Shoulder    Pain Orientation Left    Pain Descriptors / Indicators Sharp    Pain Type Chronic pain    Pain Radiating Towards left neck and shoulder    Pain Onset More than a month ago    Pain Frequency Intermittent    Multiple Pain Sites Yes    Pain Score 7     Pain Location Back    Pain Orientation Lower    Pain Descriptors / Indicators Aching    Pain Type Acute pain    Pain Onset More than a month ago    Pain Frequency Intermittent    Aggravating Factors  Standing up straight after bending forward              [facility] PT Assessment - 03/05/20 0001      AROM   AROM Assessment Site Lumbar    Lumbar Flexion Fingertips to mid-shin, pain with rising from bent position    Lumbar Extension Limited with pain bilateral lower back    Lumbar - Right Side Bend Fingertips mid thigh, pain left>right lower back    Lumbar - Left Side Bend Fingertips to knee    Lumbar - Right Rotation Limited with pain bilateral lower back    Lumbar - Left Rotation Limited with pain bilateral lower back      Strength   Strength Assessment Site Shoulder    Right/Left Shoulder Left    Left Shoulder External Rotation 4-/5  [facility] Adult PT Treatment/Exercise - 03/05/20 0001      Exercises   Exercises Shoulder;Neck;Lumbar      Lumbar Exercises: Stretches   Lower Trunk Rotation 5 reps;10 seconds      Lumbar Exercises: Supine   Pelvic Tilt 10 reps;5 seconds      Shoulder Exercises: Supine   Flexion 5 reps;AAROM   2 sets   Flexion Limitations dowel press, 2nd set with overhead pull      Shoulder Exercises: Sidelying   External Rotation 10 reps   2 sets     Shoulder Exercises: ROM/Strengthening   UBE (Upper Arm Bike) 4 min (2 fwd/bwd)      Neck Exercises: Stretches   Upper Trapezius Stretch 30 seconds                  PT Education - 03/05/20 1038    Education Details HEP update    Person(s) Educated Patient    Methods Explanation;Demonstration;Verbal cues;Handout    Comprehension Verbalized understanding;Need further instruction;Returned demonstration;Verbal cues required            PT Short Term Goals - 02/13/20 2231      PT SHORT TERM GOAL #1   Title Pt will be Ind in an initial HEP    Baseline  Started on eval    Status New    Target Date 03/05/20      PT SHORT TERM GOAL #2   Title Pt will voice understanding of measures to assist with the reduction of L shoulder pain             PT Long Term Goals - 02/13/20 2233      PT LONG TERM GOAL #1   Title Increase L shoulder strength to 4+/5 for 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    Status New    Target Date 04/05/20      PT LONG TERM GOAL #3   Title Improve L shoulder pain to 4/10 or less for ADLs and work related activities    Baseline 10/10    Status New    Target Date 04/05/20      PT LONG TERM GOAL #4   Title Pt will be Ind in a final HEP to maintain or progress achieved LOF.    Status New    Target Date 04/05/20                 Plan - 03/05/20 1048    Clinical Impression Statement Patient tolerated therapy well with no adverse effects. Patient continues to report left shoulder pain that does seem rotator cuff related. Continued progression of AAROM elevation using dowel, patient with painful arc but demonstrates good total range of motion while supine. Patient also demonstrates gross limitation in lumbar motion, with pain in all directions except right side bend. Patient provided exercises to work on lumbar mobility and strength. Patient would benefit from continued skilled PT to progress left shoulder and lumbar motion and strength to reduce pain and mazimize functional level.    PT Treatment/Interventions ADLs/Self Care Home Management;Cryotherapy;Electrical Stimulation;Moist Heat;Iontophoresis 4mg /ml Dexamethasone;Therapeutic activities;Therapeutic exercise;Manual techniques;Patient/family education;Passive range of motion;Dry  needling;Taping;Joint  Manipulations    PT Next Visit Plan Review HEP and progress PRN, left shoulder manual for pain, manual for lumbar mobility PRN, continued left shoulder AAROM-AROM, progress core strength    PT Home Exercise Plan [email or web address]    Consulted and Agree with Plan of Care Patient           Patient will benefit from skilled therapeutic intervention in order to improve the following deficits and impairments:  Decreased mobility,Decreased strength,Pain,Impaired UE functional use  Visit Diagnosis: Acute pain of left shoulder due to trauma  Decreased ROM of left shoulder  Muscle weakness (generalized)  Rib pain on left side  Acute left-sided low back pain with left-sided sciatica     Problem List Patient Active Problem List   Diagnosis Date Noted  . Internal hemorrhoid, bleeding 09/20/2012    Rosana Hoes, PT, DPT, LAT, ATC 03/05/20  12:06 PM Phone: [phone] Fax: [phone]   [facility] Outpatient Rehabilitation [facility] [address] [place], [place], [address] Phone: [phone]   Fax:  [phone]  Name: Ermine Spofford MRN: [id number] Date of Birth: December 31, 1965

## 2020-03-08 ENCOUNTER — Ambulatory Visit: Payer: Medicaid Other | Admitting: Physical Therapy

## 2020-03-08 ENCOUNTER — Encounter: Payer: Self-pay | Admitting: Physical Therapy

## 2020-03-08 ENCOUNTER — Other Ambulatory Visit: Payer: Self-pay

## 2020-03-08 DIAGNOSIS — M25512 Pain in left shoulder: Secondary | ICD-10-CM | POA: Diagnosis not present

## 2020-03-08 DIAGNOSIS — M6281 Muscle weakness (generalized): Secondary | ICD-10-CM

## 2020-03-08 DIAGNOSIS — G8911 Acute pain due to trauma: Secondary | ICD-10-CM

## 2020-03-08 DIAGNOSIS — R0781 Pleurodynia: Secondary | ICD-10-CM

## 2020-03-08 DIAGNOSIS — M25612 Stiffness of left shoulder, not elsewhere classified: Secondary | ICD-10-CM

## 2020-03-08 DIAGNOSIS — M5442 Lumbago with sciatica, left side: Secondary | ICD-10-CM

## 2020-03-08 NOTE — Patient Instructions (Signed)
Access Code: [email or web address] URL: [email or web address] Date: 03/08/2020 Prepared by: Rosana Hoes  Exercises Seated Scapular Retraction - 2-3 x daily - 7 x weekly - 1 sets - 10 reps - 5 hold Gentle Upper Trap Stretch - 2-3 x daily - 7 x weekly - 2 reps - 30 seconds hold Standing Single Arm Shoulder Flexion Towel Slide at Table Top - 2-3 x daily - 7 x weekly - 1 sets - 10-20 reps - 3 hold Supine Shoulder Flexion with Dowel - 2-3 x daily - 7 x weekly - 2 sets - 10 reps Supine Lower Trunk Rotation - 2-3 x daily - 7 x weekly - 10 reps - 5 hold Supine Pelvic Tilt - 2-3 x daily - 7 x weekly - 10 reps - 5 seconds hold Supine March with Posterior Pelvic Tilt - 2-3 x daily - 7 x weekly - 2 sets - 10 reps

## 2020-03-08 NOTE — Therapy (Signed)
[facility] Outpatient Rehabilitation [facility] [address] [place], [place], [address] Phone: [phone]   Fax:  [phone]  Physical Therapy Treatment  Patient Details  Name: Robert Hooper MRN: [id number] Date of Birth: January 27, 1966 Referring Provider (PT): Lavada Mesi, MD   Encounter Date: 03/08/2020   PT End of Session - 03/08/20 1006    Visit Number 4    Number of Visits 13    Date for PT Re-Evaluation 04/02/20    Authorization Type MCD    Authorization Time Period 03/05/20 - 03/25/20    Authorization - Visit Number 2    Authorization - Number of Visits 3    Progress Note Due on Visit 10    PT Start Time 0950    PT Stop Time 1035    PT Time Calculation (min) 45 min    Activity Tolerance Patient tolerated treatment well    Behavior During Therapy [facility] for tasks assessed/performed           Past Medical History:  Diagnosis Date  . Arthritis   . Back pain    chronic  . Bipolar 1 disorder (HCC)   . Depression   . Diabetes mellitus without complication (HCC)   . Hemorrhoids   . Hypertension     History reviewed. No pertinent surgical history.  There were no vitals filed for this visit.   Subjective Assessment - 03/08/20 0956    Subjective Patient reports shoulder continues to be painful. Exercises are going pretty good.    Currently in Pain? Yes    Pain Score 9     Pain Location Shoulder    Pain Orientation Left    Pain Descriptors / Indicators Sharp    Pain Type Chronic pain    Pain Onset More than a month ago    Pain Frequency Intermittent    Pain Score 8    Pain Location Back    Pain Orientation Lower    Pain Descriptors / Indicators Sore    Pain Type Acute pain    Pain Onset More than a month ago    Pain Frequency Intermittent              [facility] PT Assessment - 03/08/20 0001      PROM   Overall PROM Comments Left shoulder PROM grossly WFL compared to opposite side                         [facility] Adult PT  Treatment/Exercise - 03/08/20 0001      Exercises   Exercises Shoulder;Lumbar      Lumbar Exercises: Stretches   Lower Trunk Rotation 5 reps;10 seconds      Lumbar Exercises: Aerobic   UBE (Upper Arm Bike) L5 x 5 min with UE/LE      Lumbar Exercises: Supine   Pelvic Tilt 10 reps;5 seconds    Bent Knee Raise 10 reps   2 sets   Bent Knee Raise Limitations cued for PPT      Shoulder Exercises: Supine   Flexion 10 reps;AAROM   2 sets   Flexion Limitations 1st set dowel press with overhead pull, 2nd set straight arm flexion      Shoulder Exercises: Standing   Flexion 10 reps    Flexion Limitations towel slide at wall      Shoulder Exercises: Pulleys   Scaption 3 minutes      Manual Therapy   Manual Therapy Joint mobilization  Joint Mobilization left GHJ primarily inferior/posterior at various ranges of elevation grade III-IV                  PT Education - 03/08/20 0947    Education Details HEP update    Person(s) Educated Patient    Methods Explanation;Demonstration;Verbal cues;Handout    Comprehension Verbalized understanding;Need further instruction;Returned demonstration;Verbal cues required            PT Short Term Goals - 03/08/20 1043      PT SHORT TERM GOAL #1   Title Pt will be Ind in an initial HEP    Baseline Started on eval    Status On-going    Target Date 03/05/20      PT SHORT TERM GOAL #2   Title Pt will voice understanding of measures to assist with the reduction of L shoulder pain    Status Achieved             PT Long Term Goals - 02/13/20 2233      PT LONG TERM GOAL #1   Title Increase L shoulder strength to 4+/5 for 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    Status New     Target Date 04/05/20      PT LONG TERM GOAL #3   Title Improve L shoulder pain to 4/10 or less for ADLs and work related activities    Baseline 10/10    Status New    Target Date 04/05/20      PT LONG TERM GOAL #4   Title Pt will be Ind in a final HEP to maintain or progress achieved LOF.    Status New    Target Date 04/05/20                 Plan - 03/08/20 1008    Clinical Impression Statement Patient tolerated therapy well with no adverse effects. He does continue to report high pain levels at rest but is able to tolerate exercise well. Progress shoulder elevation exercises to standing with towel slide, patient did report painful arc with lowering so limited arc of motion to reduce pain. Progress light core strengthening with good tolerance. Patient would benefit from continued skilled PT to progress left shoulder and lumbar motion and strength to reduce pain and mazimize functional level.    PT Treatment/Interventions ADLs/Self Care Home Management;Cryotherapy;Electrical Stimulation;Moist Heat;Iontophoresis 4mg /ml Dexamethasone;Therapeutic activities;Therapeutic exercise;Manual techniques;Patient/family education;Passive range of motion;Dry needling;Taping;Joint Manipulations    PT Next Visit Plan Review HEP and progress PRN, left shoulder manual for pain, manual for lumbar mobility PRN, continued left shoulder AAROM-AROM, progress core strength    PT Home Exercise Plan [email or web address]    Consulted and Agree with Plan of Care Patient           Patient will benefit from skilled therapeutic intervention in order to improve the following deficits and impairments:  Decreased mobility,Decreased strength,Pain,Impaired UE functional use  Visit Diagnosis: Acute pain of left shoulder due to trauma  Decreased ROM of left shoulder  Muscle weakness (generalized)  Rib pain on left side  Acute left-sided low back pain with left-sided sciatica  Problem List Patient Active Problem  List   Diagnosis Date Noted  . Internal hemorrhoid, bleeding 09/20/2012    Rosana Hoes, PT, DPT, LAT, ATC 03/08/20  10:43 AM Phone: [phone] Fax: [phone]   [facility] Outpatient Rehabilitation [facility] [address] [place], [place], [address] Phone: [phone]   Fax:  [phone]  Name: Robert Hooper MRN: [id number] Date of Birth: 11/15/1965

## 2020-03-12 ENCOUNTER — Other Ambulatory Visit: Payer: Self-pay

## 2020-03-12 ENCOUNTER — Encounter: Payer: Self-pay | Admitting: Physical Therapy

## 2020-03-12 ENCOUNTER — Ambulatory Visit: Payer: Medicaid Other | Admitting: Physical Therapy

## 2020-03-12 DIAGNOSIS — R0781 Pleurodynia: Secondary | ICD-10-CM

## 2020-03-12 DIAGNOSIS — M25612 Stiffness of left shoulder, not elsewhere classified: Secondary | ICD-10-CM

## 2020-03-12 DIAGNOSIS — R0789 Other chest pain: Secondary | ICD-10-CM

## 2020-03-12 DIAGNOSIS — G8911 Acute pain due to trauma: Secondary | ICD-10-CM

## 2020-03-12 DIAGNOSIS — M5442 Lumbago with sciatica, left side: Secondary | ICD-10-CM

## 2020-03-12 DIAGNOSIS — M6281 Muscle weakness (generalized): Secondary | ICD-10-CM

## 2020-03-12 DIAGNOSIS — M25512 Pain in left shoulder: Secondary | ICD-10-CM | POA: Diagnosis not present

## 2020-03-12 NOTE — Patient Instructions (Signed)
Access Code: [email or web address] URL: [email or web address] Date: 03/12/2020 Prepared by: Rosana Hoes  Exercises Gentle Upper Trap Stretch - 2-3 x daily - 7 x weekly - 2 reps - 30 seconds hold Shoulder Flexion Wall Slide with Towel - 1-2 x daily - 7 x weekly - 2 sets - 10 reps Sidelying Shoulder External Rotation - 1-2 x daily - 7 x weekly - 2 sets - 15 reps Sidelying Shoulder Abduction Palm Forward - 1-2 x daily - 7 x weekly - 2 sets - 10 reps Banded Row - 1-2 x daily - 7 x weekly - 2 sets - 15 reps Supine Lower Trunk Rotation - 2-3 x daily - 7 x weekly - 10 reps - 5 hold Supine Pelvic Tilt - 2-3 x daily - 7 x weekly - 10 reps - 5 seconds hold Supine March with Posterior Pelvic Tilt - 2-3 x daily - 7 x weekly - 2 sets - 10 reps

## 2020-03-12 NOTE — Therapy (Signed)
[facility] Outpatient Rehabilitation [facility] [address] [place], [place], [address] Phone: [phone]   Fax:  [phone]  Physical Therapy Treatment  Patient Details  Name: Robert Hooper MRN: [id number] Date of Birth: Mar 02, 1965 Referring Provider (PT): Lavada Mesi, MD   Encounter Date: 03/12/2020   PT End of Session - 03/12/20 0947    Visit Number 5    Number of Visits 13    Date for PT Re-Evaluation 04/02/20    Authorization Type MCD    Authorization Time Period 03/05/20 - 03/25/20    Authorization - Visit Number 3    Authorization - Number of Visits 3    PT Start Time 0928   PT began late due to patient discussing billing at front desk   PT Stop Time 1000    PT Time Calculation (min) 32 min    Activity Tolerance Patient tolerated treatment well    Behavior During Therapy [facility] for tasks assessed/performed           Past Medical History:  Diagnosis Date  . Arthritis   . Back pain    chronic  . Bipolar 1 disorder (HCC)   . Depression   . Diabetes mellitus without complication (HCC)   . Hemorrhoids   . Hypertension     History reviewed. No pertinent surgical history.  There were no vitals filed for this visit.   Subjective Assessment - 03/12/20 0932    Subjective Patient reports his shoulder is on fire today, with no apparent mechansim. He isn't sure why it is so sore. States the back is "up and down," hurts more in the morning when he is brushing his teeth and then straightens back up.    Limitations Lifting;House hold activities    Patient Stated Goals For pain to get better, to have good use of L shoulder and back, and to be able to return to work.    Currently in Pain? Yes    Pain Score 9     Pain Location Shoulder    Pain Orientation Left    Pain Descriptors / Indicators Burning;Throbbing    Pain Type Acute pain    Pain Onset More than a month ago    Pain Frequency Intermittent    Pain Score 7    Pain Location Back    Pain  Orientation Lower    Pain Descriptors / Indicators Sore    Pain Type Acute pain    Pain Onset More than a month ago    Pain Frequency Intermittent              [facility] PT Assessment - 03/12/20 0001      Assessment   Medical Diagnosis Acute pain of left shoulder; Acute left-sided low back pain with left-sided sciatica; Rib pain on left side    Referring Provider (PT) Hilts, Michael, MD    Onset Date/Surgical Date 01/09/20      Precautions   Precautions None      Restrictions   Weight Bearing Restrictions No      Balance Screen   Has the patient fallen in the past 6 months No    Has the patient had a decrease in activity level because of a fear of falling?  No    Is the patient reluctant to leave their home because of a fear of falling?  No      Prior Function   Level of Independence Independent      Observation/Other Assessments   Focus on  Therapeutic Outcomes (FOTO)  NA - MCD      Sensation   Light Touch Appears Intact      AROM   Overall AROM Comments Patient able to demonstrate full left shoulder AAROM    Left Shoulder Flexion 120 Degrees   painful arc noted   Left Shoulder ABduction 110 Degrees   painful arc noted   Left Shoulder External Rotation 55 Degrees   pain at end range     PROM   Overall PROM Comments Left shoulder PROM grossly WFL compared to opposite side      Strength   Overall Strength Comments Left shoulder strength assessed within available range    Left Shoulder Flexion 4-/5    Left Shoulder ABduction 4-/5    Left Shoulder Internal Rotation 4+/5    Left Shoulder External Rotation 4-/5      Transfers   Transfers Independent with all Transfers                         [facility] Adult PT Treatment/Exercise - 03/12/20 0001      Exercises   Exercises Shoulder      Lumbar Exercises: Aerobic   Nustep L5 x 5 min with UE/LE      Shoulder Exercises: Supine   Other Supine Exercises Rhytmic stabilization at 90 deg flex with light  purturbations applied at elbow 2 x 30 sec      Shoulder Exercises: Sidelying   External Rotation 15 reps   2 sets   ABduction 10 reps   2 sets     Shoulder Exercises: Standing   Flexion 10 reps   2 sets   Flexion Limitations towel slide at wall    Shoulder Elevation 10 reps    Shoulder Elevation Limitations hands grasped well arm AAROM through full range      Manual Therapy   Manual Therapy Joint mobilization    Joint Mobilization Left GHJ primarily inferior/posterior mobs grade II-III at various ranges of elevation                  PT Education - 03/12/20 0947    Education Details HEP update    Person(s) Educated Patient    Methods Explanation;Demonstration;Verbal cues;Handout    Comprehension Verbalized understanding;Returned demonstration;Verbal cues required;Need further instruction            PT Short Term Goals - 03/12/20 0952      PT SHORT TERM GOAL #1   Title Pt will be Ind in an initial HEP    Baseline Patient was I with initial HEP, progressing toward LTG final HEP    Status Achieved    Target Date 03/05/20      PT SHORT TERM GOAL #2   Title Pt will voice understanding of measures to assist with the reduction of L shoulder pain    Status Achieved             PT Long Term Goals - 03/12/20 0953      PT LONG TERM GOAL #1   Title Increase L shoulder strength to 4+/5 for improved 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    Baseline Patient continues to demonstrate gross left shoulder AROM deficit with painful arc throughout shoulder elevation    Status On-going    Target Date 04/05/20      PT LONG TERM GOAL #3    Title Improve L shoulder pain to 4/10 or less for ADLs and work related activities    Baseline Patient continues to report 9/10 pain level    Status On-going    Target Date 04/05/20      PT LONG TERM GOAL #4   Title Pt will be Ind in a final HEP to maintain or progress achieved LOF.    Baseline Progressing with final HEP    Status On-going    Target Date 04/05/20      PT LONG TERM GOAL #5   Title Patient will report </= 4/10 lower back pain with activies such as brushing teeth in morning to reduce functional limitations    Status New    Target Date 04/05/20                 Plan - 03/12/20 1004    Clinical Impression Statement Patient tolerated therapy well with no adverse effects, but he was reporting increased left shoulder pain with exercise. Patient does exhibit improved range of motion and strength compared to initial evaluation, but continues to demonstrate gross rotator cuff weakness and limitations with active motion secondary to pain. He is able to achieve full PROM and AAROM but reports painful arc with unassisted shoulder elevation against gravity consistent with rotator cuff pathology. Patient did tolerate progression in shoulder HEP. Due to time restrain the lower back was not addressed with visit with exercise but patient does report continued soreness. Patient would benefit from continued skilled PT to progress left shoulder and lumbar motion and strength to reduce pain and mazimize functional level.    PT Treatment/Interventions ADLs/Self Care Home Management;Cryotherapy;Electrical Stimulation;Moist Heat;Iontophoresis 4mg /ml Dexamethasone;Therapeutic activities;Therapeutic exercise;Manual techniques;Patient/family education;Passive range of motion;Dry needling;Taping;Joint Manipulations    PT Next Visit Plan Review HEP and progress PRN, left shoulder manual for pain, manual for lumbar mobility PRN, continued left shoulder AAROM-AROM and strengthening, progress core  strength    PT Home Exercise Plan [email or web address]    Consulted and Agree with Plan of Care Patient           Patient will benefit from skilled therapeutic intervention in order to improve the following deficits and impairments:  Decreased mobility,Decreased strength,Pain,Impaired UE functional use  Visit Diagnosis: Acute pain of left shoulder due to trauma  Decreased ROM of left shoulder  Muscle weakness (generalized)  Rib pain on left side  Acute left-sided low back pain with left-sided sciatica     Problem List Patient Active Problem List   Diagnosis Date Noted  . Internal hemorrhoid, bleeding 09/20/2012    09/22/2012, PT, DPT, LAT, ATC 03/12/20  10:27 AM Phone: [phone] Fax: [phone]   [facility] Outpatient Rehabilitation [facility] [address] [place], [place], [place] Phone: [phone]   Fax:  [phone]  Name: Robert Hooper MRN: [name] Date of Birth: 09-13-1965   Check all possible CPT codes: 97110- Therapeutic Exercise, [phone]- Neuro Re-education, [phone] - Gait Training, [phone] - Manual Therapy, 97530 - Therapeutic Activities, 97535 - Self Care, 97014 - Electrical stimulation (unattended) and 39030 - Iontophoresis

## 2020-03-14 ENCOUNTER — Ambulatory Visit: Payer: Medicaid Other | Admitting: Physical Therapy

## 2020-03-18 ENCOUNTER — Ambulatory Visit: Payer: Medicaid Other | Admitting: Family Medicine

## 2020-03-19 ENCOUNTER — Other Ambulatory Visit: Payer: Self-pay

## 2020-03-19 ENCOUNTER — Ambulatory Visit (INDEPENDENT_AMBULATORY_CARE_PROVIDER_SITE_OTHER): Payer: Medicaid Other | Admitting: Family Medicine

## 2020-03-19 DIAGNOSIS — M5442 Lumbago with sciatica, left side: Secondary | ICD-10-CM

## 2020-03-19 DIAGNOSIS — M25512 Pain in left shoulder: Secondary | ICD-10-CM | POA: Diagnosis not present
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
## 2020-03-27 ENCOUNTER — Ambulatory Visit: Payer: Medicaid Other | Admitting: Physical Therapy

## 2020-03-27 ENCOUNTER — Other Ambulatory Visit: Payer: Self-pay

## 2020-03-27 ENCOUNTER — Encounter: Payer: Self-pay | Admitting: Physical Therapy

## 2020-03-27 DIAGNOSIS — M25512 Pain in left shoulder: Secondary | ICD-10-CM | POA: Diagnosis not present

## 2020-03-27 DIAGNOSIS — M5442 Lumbago with sciatica, left side: Secondary | ICD-10-CM

## 2020-03-27 DIAGNOSIS — R0781 Pleurodynia: Secondary | ICD-10-CM

## 2020-03-27 DIAGNOSIS — M6281 Muscle weakness (generalized): Secondary | ICD-10-CM

## 2020-03-27 DIAGNOSIS — M25612 Stiffness of left shoulder, not elsewhere classified: Secondary | ICD-10-CM

## 2020-03-27 DIAGNOSIS — G8911 Acute pain due to trauma: Secondary | ICD-10-CM
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
## 2020-03-27 NOTE — Patient Instructions (Signed)
Access Code: [email or web address] URL: [email or web address] Date: 03/27/2020 Prepared by: Rosana Hoes  Exercises Gentle Upper Trap Stretch - 2-3 x daily - 7 x weekly - 2 reps - 30 seconds hold Shoulder Flexion Wall Slide with Towel - 1-2 x daily - 7 x weekly - 2 sets - 10 reps Sidelying Shoulder External Rotation - 1-2 x daily - 7 x weekly - 2 sets - 15 reps Sidelying Shoulder Abduction Palm Forward - 1-2 x daily - 7 x weekly - 2 sets - 10 reps Banded Row - 1-2 x daily - 7 x weekly - 2 sets - 15 reps Supine Lower Trunk Rotation - 1-2 x daily - 7 x weekly - 10 reps - 5 hold Hooklying Single Knee to Chest Stretch - 1-2 x daily - 7 x weekly - 2 reps - 30 hold Supine Piriformis Stretch with Foot on Ground - 1-2 x daily - 7 x weekly - 2 reps - 30 hold Supine Pelvic Tilt - 1-2 x daily - 7 x weekly - 10 reps - 5 seconds hold Supine March with Posterior Pelvic Tilt - 1-2 x daily - 7 x weekly - 10 reps

## 2020-03-29 ENCOUNTER — Ambulatory Visit: Payer: Medicaid Other | Admitting: Physical Therapy

## 2020-03-29 ENCOUNTER — Encounter: Payer: Self-pay | Admitting: Physical Therapy

## 2020-03-29 ENCOUNTER — Other Ambulatory Visit: Payer: Self-pay

## 2020-03-29 DIAGNOSIS — M5442 Lumbago with sciatica, left side: Secondary | ICD-10-CM

## 2020-03-29 DIAGNOSIS — M25512 Pain in left shoulder: Secondary | ICD-10-CM

## 2020-03-29 DIAGNOSIS — R0781 Pleurodynia: Secondary | ICD-10-CM

## 2020-03-29 DIAGNOSIS — M25612 Stiffness of left shoulder, not elsewhere classified: Secondary | ICD-10-CM

## 2020-03-29 DIAGNOSIS — M6281 Muscle weakness (generalized): Secondary | ICD-10-CM

## 2020-03-29 NOTE — Therapy (Signed)
[facility] [address] [place], [place], [address] Phone: [phone]   Fax:  [phone]  Physical Therapy Treatment  Patient Details  Name: Robert Hooper MRN: [id number] Date of Birth: Sep 02, 1965 Referring Provider (PT): Lavada Mesi, MD   Encounter Date: 03/29/2020   PT End of Session - 03/29/20 0825    Visit Number 7    Number of Visits 13    Date for PT Re-Evaluation 04/02/20    Authorization Time Period 03/27/2020 - 05/07/2020    Authorization - Visit Number 2    Authorization - Number of Visits 12    PT Start Time 0830    PT Stop Time 0910    PT Time Calculation (min) 40 min    Activity Tolerance Patient tolerated treatment well    Behavior During Therapy [facility] for tasks assessed/performed           Past Medical History:  Diagnosis Date  . Arthritis   . Back pain    chronic  . Bipolar 1 disorder (HCC)   . Depression   . Diabetes mellitus without complication (HCC)   . Hemorrhoids   . Hypertension     History reviewed. No pertinent surgical history.  There were no vitals filed for this visit.   Subjective Assessment - 03/29/20 0824    Subjective Patient reports he is doing alright this morning. States new exercises from last visit are ok. States he feels like his legs are sometime giving out on him that start a week or two ago, rotates between legs when he will sometime get a sharp pain in the back.    Patient Stated Goals For pain to get better, to have good use of L shoulder and back, and to be able to return to work.    Currently in Pain? Yes    Pain Score 7     Pain Location Shoulder    Pain Orientation Left    Pain Descriptors / Indicators Throbbing;Burning;Aching    Pain Type Acute pain    Pain Onset More than a month ago    Pain Frequency Intermittent    Pain Score 8    Pain Location Back    Pain Orientation Lower    Pain Descriptors / Indicators Sore    Pain Type Acute pain    Pain Onset  More than a month ago    Pain Frequency Intermittent                             [facility] Adult PT Treatment/Exercise - 03/29/20 0001      Exercises   Exercises Lumbar      Lumbar Exercises: Stretches   Passive Hamstring Stretch 2 reps;20 seconds    Passive Hamstring Stretch Limitations seated edge of mat    Single Knee to Chest Stretch 2 reps;30 seconds    Single Knee to Chest Stretch Limitations hooklying    Lower Trunk Rotation Limitations 10 x 5 sec hold    Hip Flexor Stretch 2 reps;30 seconds    Hip Flexor Stretch Limitations PROM thomas position    Piriformis Stretch 2 reps;30 seconds    Piriformis Stretch Limitations supine      Lumbar Exercises: Aerobic   Nustep L5 x 5 min with LE only      Lumbar Exercises: Supine   Clam 10 reps;3 seconds   2 sets   Clam Limitations green    Bridge 10 reps;3  seconds   2 sets   Straight Leg Raise 10 reps   patient required extra time between reps                 PT Education - 03/29/20 0825    Education Details HEP    Person(s) Educated Patient    Methods Explanation    Comprehension Verbalized understanding;Need further instruction            PT Short Term Goals - 03/12/20 0952      PT SHORT TERM GOAL #1   Title Pt will be Ind in an initial HEP    Baseline Patient was I with initial HEP, progressing toward LTG final HEP    Status Achieved    Target Date 03/05/20      PT SHORT TERM GOAL #2   Title Pt will voice understanding of measures to assist with the reduction of L shoulder pain    Status Achieved             PT Long Term Goals - 03/12/20 0953      PT LONG TERM GOAL #1   Title Increase L shoulder strength to 4+/5 for improved 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    Baseline Patient continues to demonstrate gross left shoulder AROM deficit with painful arc throughout shoulder elevation    Status On-going    Target Date 04/05/20      PT LONG TERM GOAL #3   Title Improve L shoulder pain to 4/10 or less for ADLs and work related activities    Baseline Patient continues to report 9/10 pain level    Status On-going    Target Date 04/05/20      PT LONG TERM GOAL #4   Title Pt will be Ind in a final HEP to maintain or progress achieved LOF.    Baseline Progressing with final HEP    Status On-going    Target Date 04/05/20      PT LONG TERM GOAL #5   Title Patient will report </= 4/10 lower back pain with activies such as brushing teeth in morning to reduce functional limitations    Status New    Target Date 04/05/20                 Plan - 03/29/20 0825    Clinical Impression Statement Patient tolerated therapy well with no adverse effects. Continued therapy focus on lumbar region this visit, progressing lumbar and hip mobility and core/hip strengthening with good tolerance. No radicular symptoms reported this visit so unclear what is causing his reported legs giving out. No change to current HEP this visit. Patient would benefit from continued skilled PT to progress left shoulder and lumbar motion and strength to reduce pain and mazimize functional level.    PT Treatment/Interventions ADLs/Self Care Home Management;Cryotherapy;Electrical Stimulation;Moist Heat;Iontophoresis 4mg /ml Dexamethasone;Therapeutic activities;Therapeutic exercise;Manual techniques;Patient/family education;Passive range of motion;Dry needling;Taping;Joint Manipulations    PT Next Visit Plan Review HEP and progress PRN, left shoulder manual for pain, manual for lumbar mobility PRN, continued  left shoulder AAROM-AROM and strengthening, progress core  strength    PT Home Exercise Plan [id number]    Consulted and Agree with Plan of Care Patient           Patient will benefit from skilled therapeutic intervention in order to improve the following deficits and impairments:  Decreased mobility,Decreased strength,Pain,Impaired UE functional use  Visit Diagnosis: Acute left-sided low back pain with left-sided sciatica  Rib pain on left side  Muscle weakness (generalized)  Decreased ROM of left shoulder  Acute pain of left shoulder due to trauma     Problem List Patient Active Problem List   Diagnosis Date Noted  . Pemphigus vulgaris 06/23/2019  . Mucosal irritation of oral cavity 04/13/2019  . Epistaxis, recurrent 02/13/2019  . Pharyngitis 02/13/2019  . Internal hemorrhoid, bleeding 09/20/2012    Rosana Hoes, PT, DPT, LAT, ATC 03/29/20  9:19 AM Phone: [phone] Fax: [phone]   [facility] [address] [place], [place], [address] Phone: [phone]   Fax:  [phone]  Name: Robert Hooper MRN: [id number] Date of Birth: January 14, 1966

## 2020-03-30 ENCOUNTER — Ambulatory Visit: Payer: Medicaid Other

## 2020-03-30 DIAGNOSIS — M6281 Muscle weakness (generalized): Secondary | ICD-10-CM

## 2020-03-30 DIAGNOSIS — M25512 Pain in left shoulder: Secondary | ICD-10-CM | POA: Diagnosis not present

## 2020-03-30 DIAGNOSIS — G8911 Acute pain due to trauma: Secondary | ICD-10-CM

## 2020-03-30 DIAGNOSIS — M5442 Lumbago with sciatica, left side: Secondary | ICD-10-CM

## 2020-03-30 DIAGNOSIS — M25612 Stiffness of left shoulder, not elsewhere classified: Secondary | ICD-10-CM

## 2020-03-30 DIAGNOSIS — R0781 Pleurodynia: Secondary | ICD-10-CM

## 2020-03-30 NOTE — Therapy (Signed)
[facility] [address] [place], [place], [address] Phone: [phone]   Fax:  [phone]  Physical Therapy Treatment  Patient Details  Name: Robert Hooper MRN: [id number] Date of Birth: 11/10/1965 Referring Provider (PT): Lavada Mesi, MD   Encounter Date: 03/30/2020   PT End of Session - 03/30/20 1151    Visit Number 8    Number of Visits 14    Date for PT Re-Evaluation 05/07/20    Authorization Type MCD    Authorization Time Period 03/27/2020 - 05/07/2020    Authorization - Visit Number 3    Authorization - Number of Visits 12    Progress Note Due on Visit 10    PT Start Time 1122    PT Stop Time 1205    PT Time Calculation (min) 43 min           Past Medical History:  Diagnosis Date  . Arthritis   . Back pain    chronic  . Bipolar 1 disorder (HCC)   . Depression   . Diabetes mellitus without complication (HCC)   . Hemorrhoids   . Hypertension     History reviewed. No pertinent surgical history.  There were no vitals filed for this visit.   Subjective Assessment - 03/30/20 1128    Subjective Pt states his L neck and upper trap is bothering him. Re:low back, he has good and back days    Patient Stated Goals For pain to get better, to have good use of L shoulder and back, and to be able to return to work.    Currently in Pain? Yes    Pain Score 8     Pain Location Shoulder    Pain Orientation Left    Pain Descriptors / Indicators Throbbing;Burning;Aching    Pain Type Acute pain    Pain Onset More than a month ago    Pain Frequency Constant    Pain Score 8    Pain Location Back    Pain Orientation Lower    Pain Descriptors / Indicators Sore    Pain Type Acute pain    Pain Onset More than a month ago    Pain Frequency Intermittent                             [facility] Adult PT Treatment/Exercise - 03/30/20 0001      Exercises   Exercises Lumbar;Neck      Lumbar Exercises:  Stretches   Passive Hamstring Stretch 2 reps;20 seconds    Passive Hamstring Stretch Limitations seated edge of mat    Single Knee to Chest Stretch 2 reps;30 seconds    Single Knee to Chest Stretch Limitations hooklying    Lower Trunk Rotation Limitations 10 x 5 sec hold    Hip Flexor Stretch 2 reps;30 seconds    Hip Flexor Stretch Limitations PROM thomas position    Piriformis Stretch 2 reps;30 seconds    Piriformis Stretch Limitations supine      Lumbar Exercises: Supine   Pelvic Tilt 10 reps;5 seconds    Clam 10 reps;3 seconds   2 sets   Clam Limitations green    Bent Knee Raise 10 reps    Bent Knee Raise Limitations c PPT    Bridge 10 reps;3 seconds   2 sets   Straight Leg Raise 10 reps   decreased eccentric control     Manual Therapy   Manual Therapy  Soft tissue mobilization    Soft tissue mobilization STM and triiger point release to the L upper trap and levator      Neck Exercises: Stretches   Upper Trapezius Stretch Left;2 reps;20 seconds    Levator Stretch Left;2 reps;20 seconds                    PT Short Term Goals - 03/12/20 0952      PT SHORT TERM GOAL #1   Title Pt will be Ind in an initial HEP    Baseline Patient was I with initial HEP, progressing toward LTG final HEP    Status Achieved    Target Date 03/05/20      PT SHORT TERM GOAL #2   Title Pt will voice understanding of measures to assist with the reduction of L shoulder pain    Status Achieved             PT Long Term Goals - 03/30/20 2020      PT LONG TERM GOAL #1   Title Increase L shoulder strength to 4+/5 for 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    Baseline Patient continues to demonstrate gross left shoulder AROM deficit with painful arc throughout shoulder elevation    Status On-going    Target Date 05/07/20      PT LONG TERM GOAL #3   Title Improve L shoulder pain to 4/10 or less for ADLs and work related activities    Baseline Patient continues to report 9/10 pain level    Status On-going    Target Date 05/07/20      PT LONG TERM GOAL #4   Title Pt will be Ind in a final HEP to maintain or progress achieved LOF.    Baseline Progressing with final HEP    Status On-going    Target Date 05/07/20      PT LONG TERM GOAL #5   Title Patient will report </= 4/10 lower back pain with activies such as brushing teeth in morning to reduce functional limitations    Status On-going    Target Date 05/07/20                 Plan - 03/30/20 2017    Clinical Impression Statement PT was provided to address pt's report of L cervical and upper shoulder pain per STM and trigger point release of the upper trap and levator. PT continued lumbopelvic/LE flexibility and strengthening to reduce  pain and improve function. Following the session, the pt reported his L shoulder pain had decreased. Pt is scheduled for a MRI of his L shoulder on 04/06/20. Pt will benefit from skilled PT 2w3 to reduce the pain and optimize the function of L shoulder and low back.    Personal Factors and Comorbidities Comorbidity 1;Comorbidity 2;Comorbidity 3+    Comorbidities DM, HTN, arthritis, bipolar depression    Examination-Activity Limitations Bathing;Carry;Dressing;Hygiene/Grooming;Toileting;Reach Overhead    Examination-Participation Restrictions Occupation    Stability/Clinical Decision Making Evolving/Moderate complexity    Clinical Decision Making  Moderate    Rehab Potential Good    PT Frequency 2x / week    PT Duration 3 weeks    PT Treatment/Interventions ADLs/Self Care Home Management;Cryotherapy;Electrical Stimulation;Moist  Heat;Iontophoresis 4mg /ml Dexamethasone;Therapeutic activities;Therapeutic exercise;Manual techniques;Patient/family education;Passive range of motion;Dry needling;Taping;Joint Manipulations    PT Next Visit Plan Review HEP and progress PRN, left shoulder manual for pain, manual for lumbar mobility PRN, continued left shoulder AAROM-AROM and strengthening, progress core strength    PT Home Exercise Plan [id number]- Levator stretch added    Consulted and Agree with Plan of Care Patient           Patient will benefit from skilled therapeutic intervention in order to improve the following deficits and impairments:  Decreased mobility,Decreased strength,Pain,Impaired UE functional use  Visit Diagnosis: Acute left-sided low back pain with left-sided sciatica  Rib pain on left side  Muscle weakness (generalized)  Decreased ROM of left shoulder  Acute pain of left shoulder due to trauma     Problem List Patient Active Problem List   Diagnosis Date Noted  . Pemphigus vulgaris 06/23/2019  . Mucosal irritation of oral cavity 04/13/2019  . Epistaxis, recurrent 02/13/2019  . Pharyngitis 02/13/2019  . Internal hemorrhoid, bleeding 09/20/2012   09/22/2012 MS, PT 03/30/20 9:29 PM   [facility] [address] [place], [place], [place] Phone: [phone]   Fax:  [phone]  Name: Robert Hooper MRN: [name] Date of Birth: Feb 25, 1965

## 2020-04-01 ENCOUNTER — Ambulatory Visit: Payer: Medicaid Other | Admitting: Physical Therapy

## 2020-04-03 ENCOUNTER — Ambulatory Visit: Payer: Medicaid Other | Admitting: Physical Therapy

## 2020-04-03 ENCOUNTER — Ambulatory Visit: Payer: Medicaid Other | Attending: Family Medicine

## 2020-04-03 DIAGNOSIS — G8911 Acute pain due to trauma: Secondary | ICD-10-CM | POA: Insufficient documentation

## 2020-04-03 DIAGNOSIS — M25612 Stiffness of left shoulder, not elsewhere classified: Secondary | ICD-10-CM | POA: Insufficient documentation

## 2020-04-03 DIAGNOSIS — M6281 Muscle weakness (generalized): Secondary | ICD-10-CM | POA: Insufficient documentation

## 2020-04-03 DIAGNOSIS — M25512 Pain in left shoulder: Secondary | ICD-10-CM | POA: Insufficient documentation

## 2020-04-03 DIAGNOSIS — M5442 Lumbago with sciatica, left side: Secondary | ICD-10-CM | POA: Insufficient documentation

## 2020-04-03 DIAGNOSIS — R0781 Pleurodynia: Secondary | ICD-10-CM | POA: Insufficient documentation

## 2020-04-04 ENCOUNTER — Telehealth: Payer: Self-pay

## 2020-04-04 ENCOUNTER — Encounter: Payer: Medicaid Other | Admitting: Physical Therapy

## 2020-04-04 NOTE — Telephone Encounter (Signed)
Spoke with pt re: missed appt yesterday. Pt reports he was running late and called to inform the clinic. Because the pt was going to later than 15 mins, the appt was cancelled. Reminded pt of his 04/08/20 appt at 10:45.

## 2020-04-06 ENCOUNTER — Ambulatory Visit
Admission: RE | Admit: 2020-04-06 | Discharge: 2020-04-06 | Disposition: A | Discharge status: Home / Self Care | Payer: Medicaid Other | Source: Ambulatory Visit | Attending: Family Medicine | Admitting: Family Medicine

## 2020-04-06 ENCOUNTER — Other Ambulatory Visit: Payer: Self-pay

## 2020-04-06 DIAGNOSIS — M25512 Pain in left shoulder: Secondary | ICD-10-CM
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
## 2020-04-08 ENCOUNTER — Encounter: Payer: Self-pay | Admitting: Physical Therapy

## 2020-04-08 ENCOUNTER — Ambulatory Visit: Payer: Medicaid Other | Admitting: Physical Therapy

## 2020-04-08 ENCOUNTER — Telehealth: Payer: Self-pay | Admitting: Family Medicine

## 2020-04-08 ENCOUNTER — Other Ambulatory Visit: Payer: Self-pay

## 2020-04-08 DIAGNOSIS — M5442 Lumbago with sciatica, left side: Secondary | ICD-10-CM

## 2020-04-08 DIAGNOSIS — R0781 Pleurodynia: Secondary | ICD-10-CM

## 2020-04-08 DIAGNOSIS — M6281 Muscle weakness (generalized): Secondary | ICD-10-CM | POA: Diagnosis present

## 2020-04-08 DIAGNOSIS — M25512 Pain in left shoulder: Secondary | ICD-10-CM

## 2020-04-08 DIAGNOSIS — G8911 Acute pain due to trauma: Secondary | ICD-10-CM | POA: Diagnosis present

## 2020-04-08 DIAGNOSIS — M25612 Stiffness of left shoulder, not elsewhere classified: Secondary | ICD-10-CM

## 2020-04-08 NOTE — Telephone Encounter (Signed)
Tried calling patient. No answer. Unable to LM. VM not set up yet.

## 2020-04-08 NOTE — Therapy (Signed)
[facility] [address] [place], [place], [address] Phone: [phone]   Fax:  [phone]  Physical Therapy Treatment  Patient Details  Name: Robert Hooper MRN: [id number] Date of Birth: Jun 07, 1965 Referring Provider (PT): Lavada Mesi, MD   Encounter Date: 04/08/2020   PT End of Session - 04/08/20 1020    Visit Number 9    Number of Visits 14    Date for PT Re-Evaluation 05/07/20    Authorization Type MCD    Authorization Time Period 03/27/2020 - 05/07/2020    Authorization - Visit Number 4    Authorization - Number of Visits 12    PT Start Time 1025    PT Stop Time 1105    PT Time Calculation (min) 40 min    Activity Tolerance Patient tolerated treatment well    Behavior During Therapy [facility] for tasks assessed/performed           Past Medical History:  Diagnosis Date  . Arthritis   . Back pain    chronic  . Bipolar 1 disorder (HCC)   . Depression   . Diabetes mellitus without complication (HCC)   . Hemorrhoids   . Hypertension     History reviewed. No pertinent surgical history.  There were no vitals filed for this visit.   Subjective Assessment - 04/08/20 1027    Subjective Patient reports his back is feeling alright this morning but his shoulder is agitated. Noted that the shoulder felt pretty good yesterday but started bothering his this morning and he doesn't know why.    Patient Stated Goals For pain to get better, to have good use of L shoulder and back, and to be able to return to work.    Currently in Pain? Yes    Pain Score 10-Worst pain ever    Pain Location Shoulder    Pain Orientation Left    Pain Descriptors / Indicators Throbbing;Burning    Pain Type Acute pain    Pain Onset More than a month ago    Pain Frequency Constant    Pain Score 7    Pain Location Back    Pain Orientation Lower    Pain Descriptors / Indicators Sore    Pain Type Acute pain    Pain Onset More than a month ago     Pain Frequency Intermittent              [facility] PT Assessment - 04/08/20 0001      Assessment   Medical Diagnosis Acute pain of left shoulder; Acute left-sided low back pain with left-sided sciatica; Rib pain on left side    Referring Provider (PT) Hilts, Michael, MD    Onset Date/Surgical Date 01/09/20      Precautions   Precautions None      Restrictions   Weight Bearing Restrictions No      Balance Screen   Has the patient fallen in the past 6 months No    Has the patient had a decrease in activity level because of a fear of falling?  No    Is the patient reluctant to leave their home because of a fear of falling?  No      AROM   Overall AROM Comments Patient continues to demonstrate limitations in left shoulder AROM all directions                         [facility] Adult PT Treatment/Exercise - 04/08/20  0001      Exercises   Exercises Lumbar      Lumbar Exercises: Aerobic   Nustep L5 x 5 min with UE/LE      Lumbar Exercises: Seated   Sit to Stand 5 reps   2 sets     Lumbar Exercises: Supine   Clam 15 reps;3 seconds   2 sets   Clam Limitations green    Bent Knee Raise 20 reps   2 sets   Bent Knee Raise Limitations green    Bridge 10 reps;3 seconds   2 sets   Straight Leg Raise 10 reps   2 sets     Lumbar Exercises: Sidelying   Hip Abduction 10 reps   2 sets                 PT Education - 04/08/20 1019    Education Details HEP    Person(s) Educated Patient    Methods Explanation    Comprehension Verbalized understanding;Need further instruction            PT Short Term Goals - 03/12/20 0952      PT SHORT TERM GOAL #1   Title Pt will be Ind in an initial HEP    Baseline Patient was I with initial HEP, progressing toward LTG final HEP    Status Achieved    Target Date 03/05/20      PT SHORT TERM GOAL #2   Title Pt will voice understanding of measures to assist with the reduction of L shoulder pain    Status Achieved              PT Long Term Goals - 03/30/20 2020      PT LONG TERM GOAL #1   Title Increase L shoulder strength to 4+/5 for 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    Baseline Patient continues to demonstrate gross left shoulder AROM deficit with painful arc throughout shoulder elevation    Status On-going    Target Date 05/07/20      PT LONG TERM GOAL #3   Title Improve L shoulder pain to 4/10 or less for ADLs and work related activities    Baseline Patient continues to report 9/10 pain level    Status On-going    Target Date 05/07/20      PT LONG TERM GOAL #4   Title Pt will be Ind in a final HEP to maintain or progress achieved LOF.    Baseline Progressing with final HEP    Status On-going    Target Date 05/07/20      PT LONG TERM GOAL #5   Title Patient will report </= 4/10 lower back pain with activies such as brushing teeth in morning to reduce functional limitations    Status On-going    Target Date 05/07/20                 Plan - 04/08/20 1020    Clinical Impression Statement Patient tolerated therapy well with no adverse effects. Therapy focused on  continued progression of core/hip strengthening to improve lumbopelvic control. Patient did not report any increased low back pain with therapy but did report fatigue and required frequent rest breaks indicating endurance deficit. HEP udpated for core/hip strengthening. Patient had recent MRI for left shoulder and he was encouraged to follow-up with his doctor regarding results. Patient would benefit from continued skilled PT to progress left shoulder and lumbar motion and strength  to reduce pain and mazimize functional level.    PT Treatment/Interventions ADLs/Self Care Home Management;Cryotherapy;Electrical Stimulation;Moist Heat;Iontophoresis 4mg /ml Dexamethasone;Therapeutic activities;Therapeutic exercise;Manual techniques;Patient/family education;Passive range of motion;Dry needling;Taping;Joint Manipulations    PT Next Visit Plan Review HEP and progress PRN, left shoulder manual for pain, manual for lumbar mobility PRN, continued left shoulder AAROM-AROM and strengthening, progress core strength    PT Home Exercise Plan [email or web address]    Consulted and Agree with Plan of Care Patient           Patient will benefit from skilled therapeutic intervention in order to improve the following deficits and impairments:  Decreased mobility,Decreased strength,Pain,Impaired UE functional use  Visit Diagnosis: Acute left-sided low back pain with left-sided sciatica  Rib pain on left side  Muscle weakness (generalized)  Decreased ROM of left shoulder  Acute pain of left shoulder due to trauma     Problem List Patient Active Problem List   Diagnosis Date Noted  . Pemphigus vulgaris 06/23/2019  . Mucosal irritation of oral cavity 04/13/2019  . Epistaxis, recurrent 02/13/2019  . Pharyngitis 02/13/2019  . Internal hemorrhoid, bleeding 09/20/2012    09/22/2012, PT, DPT, LAT, ATC 04/08/20  11:12 AM Phone: [phone] Fax: [phone]   [facility] [address] [place], [place], [place] Phone: [phone]   Fax:  [phone]  Name: Robert Hooper MRN: [name] Date of Birth: 10-21-1965

## 2020-04-08 NOTE — Telephone Encounter (Signed)
MRI shows complete tears of the supraspinatus and infraspinatus tendons of the rotator cuff.  This will need surgical repair.   Please schedule consult with SD to discuss.

## 2020-04-08 NOTE — Patient Instructions (Signed)
Access Code: [email or web address] URL: [email or web address] Date: 04/08/2020 Prepared by: Rosana Hoes  Exercises Gentle Levator Scapulae Stretch - 2-3 x daily - 7 x weekly - 2 sets - 10 reps - 20 hold Gentle Upper Trap Stretch - 2-3 x daily - 7 x weekly - 2 reps - 30 seconds hold Shoulder Flexion Wall Slide with Towel - 1-2 x daily - 7 x weekly - 2 sets - 10 reps Sidelying Shoulder External Rotation - 1-2 x daily - 7 x weekly - 2 sets - 15 reps Sidelying Shoulder Abduction Palm Forward - 1-2 x daily - 7 x weekly - 2 sets - 10 reps Banded Row - 1-2 x daily - 7 x weekly - 2 sets - 15 reps Supine Lower Trunk Rotation - 1-2 x daily - 7 x weekly - 10 reps - 5 hold Hooklying Single Knee to Chest Stretch - 1-2 x daily - 7 x weekly - 2 reps - 30 hold Supine Piriformis Stretch with Foot on Ground - 1-2 x daily - 7 x weekly - 2 reps - 30 hold Supine Pelvic Tilt - 1-2 x daily - 7 x weekly - 10 reps - 5 seconds hold Hooklying Clamshell with Resistance - 1 x daily - 7 x weekly - 2 sets - 15 reps - 3 hold Supine March with Resistance Band - 1 x daily - 7 x weekly - 2 sets - 20 reps Supine Bridge - 1 x daily - 7 x weekly - 2 sets - 10 reps - 3 hold

## 2020-04-10 ENCOUNTER — Ambulatory Visit: Payer: Medicaid Other | Admitting: Physical Therapy

## 2020-04-10 NOTE — Telephone Encounter (Signed)
Called patient back advised of below. He already had appt scheduled for 03/16.  Can discuss options with Dr August Saucer at this time.

## 2020-04-15 ENCOUNTER — Encounter: Payer: Medicaid Other | Admitting: Physical Therapy

## 2020-04-17 ENCOUNTER — Ambulatory Visit: Payer: Medicaid Other | Admitting: Physical Therapy

## 2020-04-17 ENCOUNTER — Encounter: Payer: Self-pay | Admitting: Orthopedic Surgery

## 2020-04-17 ENCOUNTER — Ambulatory Visit (INDEPENDENT_AMBULATORY_CARE_PROVIDER_SITE_OTHER): Payer: Medicaid Other | Admitting: Orthopedic Surgery

## 2020-04-17 DIAGNOSIS — S46012A Strain of muscle(s) and tendon(s) of the rotator cuff of left shoulder, initial encounter: Secondary | ICD-10-CM | POA: Diagnosis not present
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
## 2020-04-18 ENCOUNTER — Encounter: Payer: Self-pay | Admitting: Orthopedic Surgery

## 2020-04-18 MED ORDER — BUPIVACAINE HCL 0.5 % IJ SOLN
9.0000 mL | INTRAMUSCULAR | Status: AC | PRN
  Administered 2020-04-17: 9 mL via INTRA_ARTICULAR

## 2020-04-18 MED ORDER — LIDOCAINE HCL 1 % IJ SOLN
5.0000 mL | INTRAMUSCULAR | Status: AC | PRN
  Administered 2020-04-17: 5 mL

## 2020-04-20 ENCOUNTER — Ambulatory Visit: Payer: Medicaid Other | Admitting: Physical Therapy

## 2020-04-20 ENCOUNTER — Encounter: Payer: Self-pay | Admitting: Physical Therapy

## 2020-04-20 ENCOUNTER — Other Ambulatory Visit: Payer: Self-pay

## 2020-04-20 DIAGNOSIS — M25612 Stiffness of left shoulder, not elsewhere classified: Secondary | ICD-10-CM

## 2020-04-20 DIAGNOSIS — M5442 Lumbago with sciatica, left side: Secondary | ICD-10-CM | POA: Diagnosis not present

## 2020-04-20 DIAGNOSIS — R0781 Pleurodynia: Secondary | ICD-10-CM

## 2020-04-20 DIAGNOSIS — G8911 Acute pain due to trauma: Secondary | ICD-10-CM

## 2020-04-20 DIAGNOSIS — M6281 Muscle weakness (generalized): Secondary | ICD-10-CM

## 2020-04-20 NOTE — Therapy (Signed)
[facility] [address] [place], [place], [address] Phone: [phone]   Fax:  [phone]  Physical Therapy Treatment  Patient Details  Name: Robert Hooper MRN: [id number] Date of Birth: 1965/11/06 Referring Provider (PT): Lavada Mesi, MD   Encounter Date: 04/20/2020   PT End of Session - 04/20/20 0947    Visit Number 10    Number of Visits 14    Date for PT Re-Evaluation 05/07/20    Authorization Type MCD    Authorization Time Period 03/27/2020 - 05/07/2020    Authorization - Visit Number 5    Authorization - Number of Visits 12    PT Start Time 0947    PT Stop Time 1030    PT Time Calculation (min) 43 min    Activity Tolerance Patient tolerated treatment well    Behavior During Therapy [facility] for tasks assessed/performed           Past Medical History:  Diagnosis Date  . Arthritis   . Back pain    chronic  . Bipolar 1 disorder (HCC)   . Depression   . Diabetes mellitus without complication (HCC)   . Hemorrhoids   . Hypertension     History reviewed. No pertinent surgical history.  There were no vitals filed for this visit.   Subjective Assessment - 04/20/20 0949    Subjective " I got my MRI and they went over it and they injected my shoulder, I know what I need to do for my shoulder, My back is giving more issues and I would like to focus on my back."              [facility] PT Assessment - 04/20/20 0001      Assessment   Medical Diagnosis Acute pain of left shoulder; Acute left-sided low back pain with left-sided sciatica; Rib pain on left side    Referring Provider (PT) Lavada Mesi, MD                         [facility] Adult PT Treatment/Exercise - 04/20/20 0001      Lumbar Exercises: Stretches   Active Hamstring Stretch 4 reps;30 seconds   PNF contract relax   Passive Hamstring Stretch 2 reps;Right;30 seconds   seated   Lower Trunk Rotation --   2 x 10 peformed throughout session      Lumbar Exercises: Aerobic   Nustep L5 x 5 min with UE/LE      Lumbar Exercises: Supine   Bent Knee Raise 10 reps   keeping core tight througout session.   Straight Leg Raise 10 reps   x 2 sets RLE only     Manual Therapy   Manual Therapy Muscle Energy Technique    Manual therapy comments MTPR along the L upper trap    Joint Mobilization LAD Grade III-Iv    Muscle Energy Technique scissor technique resisted R hip flexion and L hip extension      Neck Exercises: Stretches   Upper Trapezius Stretch 2 reps;30 seconds                  PT Education - 04/20/20 1034    Education Details muslce anatomy as it relates and effects the low back and potential SIJ.    Person(s) Educated Patient    Methods Explanation;Verbal cues    Comprehension Verbalized understanding;Verbal cues required            PT Short  Term Goals - 03/12/20 0952      PT SHORT TERM GOAL #1   Title Pt will be Ind in an initial HEP    Baseline Patient was I with initial HEP, progressing toward LTG final HEP    Status Achieved    Target Date 03/05/20      PT SHORT TERM GOAL #2   Title Pt will voice understanding of measures to assist with the reduction of L shoulder pain    Status Achieved             PT Long Term Goals - 03/30/20 2020      PT LONG TERM GOAL #1   Title Increase L shoulder strength to 4+/5 for 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    Baseline Patient continues to demonstrate gross left shoulder AROM deficit with painful arc throughout shoulder elevation    Status On-going    Target Date 05/07/20      PT LONG TERM GOAL #3   Title  Improve L shoulder pain to 4/10 or less for ADLs and work related activities    Baseline Patient continues to report 9/10 pain level    Status On-going    Target Date 05/07/20      PT LONG TERM GOAL #4   Title Pt will be Ind in a final HEP to maintain or progress achieved LOF.    Baseline Progressing with final HEP    Status On-going    Target Date 05/07/20      PT LONG TERM GOAL #5   Title Patient will report </= 4/10 lower back pain with activies such as brushing teeth in morning to reduce functional limitations    Status On-going    Target Date 05/07/20                 Plan - 04/20/20 1013    Clinical Impression Statement pt reports he saw the MD and got his results from the MRI and has opted not to have any surgery and just continue working on the shoulder strenghtening himself. Continued working on the R low back which presents as potential SIJ involvmeent which he responded well to hamstring stretch and hip flexor activation. Reviewed stretching for upper trap/ levator. end of session he noted feeling decreased tension in the back.    PT Treatment/Interventions ADLs/Self Care Home Management;Cryotherapy;Electrical Stimulation;Moist Heat;Iontophoresis 4mg /ml Dexamethasone;Therapeutic activities;Therapeutic exercise;Manual techniques;Patient/family education;Passive range of motion;Dry needling;Taping;Joint Manipulations    PT Next Visit Plan , work on addressing any issues,  progress HEP, plan to discharge in the next 1-2 visits.    PT Home Exercise Plan [email or web address]    Consulted and Agree with Plan of Care Patient           Patient will benefit from skilled therapeutic intervention in order to improve the following deficits and impairments:  Decreased mobility,Decreased strength,Pain,Impaired UE functional use  Visit Diagnosis: Acute left-sided low back pain with left-sided sciatica  Rib pain on left side  Muscle weakness (generalized)  Decreased ROM of left  shoulder  Acute pain of left shoulder due to trauma     Problem List Patient Active Problem List   Diagnosis Date Noted  . Pemphigus vulgaris 06/23/2019  . Mucosal irritation of oral cavity 04/13/2019  . Epistaxis, recurrent 02/13/2019  . Pharyngitis 02/13/2019  . Internal hemorrhoid, bleeding 09/20/2012   Lulu Riding PT, DPT, LAT, ATC  04/20/20  10:36 AM      [facility] [address] [place], [place], [address] Phone: [phone]   Fax:  [phone]  Name: Robert Hooper MRN: [id number] Date of Birth: 1965/02/15

## 2020-04-22 ENCOUNTER — Other Ambulatory Visit: Payer: Self-pay

## 2020-04-22 ENCOUNTER — Ambulatory Visit: Payer: Medicaid Other | Admitting: Physical Therapy

## 2020-04-22 ENCOUNTER — Encounter: Payer: Self-pay | Admitting: Physical Therapy

## 2020-04-22 DIAGNOSIS — M6281 Muscle weakness (generalized): Secondary | ICD-10-CM

## 2020-04-22 DIAGNOSIS — R0781 Pleurodynia: Secondary | ICD-10-CM

## 2020-04-22 DIAGNOSIS — G8911 Acute pain due to trauma: Secondary | ICD-10-CM

## 2020-04-22 DIAGNOSIS — M5442 Lumbago with sciatica, left side: Secondary | ICD-10-CM | POA: Diagnosis not present

## 2020-04-22 DIAGNOSIS — M25612 Stiffness of left shoulder, not elsewhere classified: Secondary | ICD-10-CM

## 2020-04-22 NOTE — Therapy (Signed)
[facility] [address] [place], [place], [address] Phone: [phone]   Fax:  [phone]  Physical Therapy Treatment  Patient Details  Name: Robert Hooper MRN: [id number] Date of Birth: 12/16/1965 Referring Provider (PT): Lavada Mesi, MD   Encounter Date: 04/22/2020   PT End of Session - 04/22/20 0825    Visit Number 11    Number of Visits 14    Date for PT Re-Evaluation 05/07/20    Authorization Type MCD    Authorization Time Period 03/27/2020 - 05/07/2020    Authorization - Visit Number 6    Authorization - Number of Visits 12    Progress Note Due on Visit 10    PT Start Time 0830    PT Stop Time 0910    PT Time Calculation (min) 40 min    Activity Tolerance Patient tolerated treatment well    Behavior During Therapy [facility] for tasks assessed/performed           Past Medical History:  Diagnosis Date  . Arthritis   . Back pain    chronic  . Bipolar 1 disorder (HCC)   . Depression   . Diabetes mellitus without complication (HCC)   . Hemorrhoids   . Hypertension     History reviewed. No pertinent surgical history.  There were no vitals filed for this visit.   Subjective Assessment - 04/22/20 0823    Subjective Patient reports continued right shoulder pain, that radiates up into his neck which really affects his sleeping. Notes his back is still bothering him too. He feels he is going to take it easy on the shoulder and gradually work it back.    Patient Stated Goals For pain to get better, to have good use of L shoulder and back, and to be able to return to work.    Currently in Pain? Yes    Pain Score 10-Worst pain ever    Pain Location Shoulder    Pain Orientation Left    Pain Descriptors / Indicators Throbbing;Burning    Pain Type Acute pain    Pain Onset More than a month ago    Pain Frequency Constant    Pain Score 8    Pain Location Back    Pain Orientation Lower    Pain Descriptors / Indicators  Sore    Pain Type Acute pain    Pain Onset More than a month ago    Pain Frequency Intermittent                             [facility] Adult PT Treatment/Exercise - 04/22/20 0001      Exercises   Exercises Lumbar      Lumbar Exercises: Stretches   Passive Hamstring Stretch 2 reps;20 seconds   seated edge of table   Single Knee to Chest Stretch 2 reps;20 seconds   supine   Lower Trunk Rotation Limitations 10 x 5 sec   2 sets   Piriformis Stretch 2 reps;20 seconds   seated figure-4     Lumbar Exercises: Aerobic   Nustep L5 x 5 min with UE/LE      Lumbar Exercises: Supine   Clam 15 reps;3 seconds   2 sets   Clam Limitations blue    Bent Knee Raise 10 reps   2 sets   Bridge 10 reps;3 seconds   2 sets   Straight Leg Raise 10 reps   2 sets  Lumbar Exercises: Sidelying   Hip Abduction 10 reps   2 sets                 PT Education - 04/22/20 0824    Education Details HEP    Person(s) Educated Patient    Methods Explanation    Comprehension Verbalized understanding;Need further instruction            PT Short Term Goals - 03/12/20 0952      PT SHORT TERM GOAL #1   Title Pt will be Ind in an initial HEP    Baseline Patient was I with initial HEP, progressing toward LTG final HEP    Status Achieved    Target Date 03/05/20      PT SHORT TERM GOAL #2   Title Pt will voice understanding of measures to assist with the reduction of L shoulder pain    Status Achieved             PT Long Term Goals - 03/30/20 2020      PT LONG TERM GOAL #1   Title Increase L shoulder strength to 4+/5 for 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    Baseline Patient continues to demonstrate gross left shoulder AROM deficit with painful arc throughout shoulder elevation    Status On-going    Target Date 05/07/20      PT LONG TERM GOAL #3   Title Improve L shoulder pain to 4/10 or less for ADLs and work related activities    Baseline Patient continues to report 9/10 pain level    Status On-going    Target Date 05/07/20      PT LONG TERM GOAL #4   Title Pt will be Ind in a final HEP to maintain or progress achieved LOF.    Baseline Progressing with final HEP    Status On-going    Target Date 05/07/20      PT LONG TERM GOAL #5   Title Patient will report </= 4/10 lower back pain with activies such as brushing teeth in morning to reduce functional limitations    Status On-going    Target Date 05/07/20                 Plan - 04/22/20 0826    Clinical Impression Statement Patient tolerated therapy well with no adverse effects. He continues to report high levels of pain that limit function and requires frequent rest breaks with therapy. Therapy cotninued to focus primarily on low back pain this visit with core/hip mobility and strengthening, and reviewed shoulder exercises he can perform at home. Patient has one more scheduled visit and will likely discharge at that time. Patient would benefit from continued skilled PT to ensure independence with HEP and ability to progress exercises at home.    PT Treatment/Interventions ADLs/Self Care Home Management;Cryotherapy;Electrical Stimulation;Moist Heat;Iontophoresis 4mg /ml Dexamethasone;Therapeutic activities;Therapeutic exercise;Manual techniques;Patient/family education;Passive range of motion;Dry needling;Taping;Joint Manipulations    PT Next Visit Plan Work on addressing any issues and ensure independence with HEP, plan to discharge  next visit    PT Home Exercise Plan [email or web address]    Consulted and Agree with Plan of Care Patient           Patient  will benefit from skilled therapeutic intervention in order to improve the following deficits and impairments:  Decreased mobility,Decreased strength,Pain,Impaired UE functional use  Visit Diagnosis: Acute left-sided low back pain with left-sided sciatica  Rib pain on left side  Muscle weakness (generalized)  Decreased ROM of left shoulder  Acute pain of left shoulder due to trauma     Problem List Patient Active Problem List   Diagnosis Date Noted  . Pemphigus vulgaris 06/23/2019  . Mucosal irritation of oral cavity 04/13/2019  . Epistaxis, recurrent 02/13/2019  . Pharyngitis 02/13/2019  . Internal hemorrhoid, bleeding 09/20/2012    Rosana Hoes, PT, DPT, LAT, ATC 04/22/20  9:27 AM Phone: [phone] Fax: [phone]   [facility] Outpatient Rehabilitation [facility] [address] [place], [place], [address] Phone: [phone]   Fax:  [phone]  Name: Robert Hooper MRN: [id number] Date of Birth: 09-13-1965

## 2020-04-26 ENCOUNTER — Encounter: Payer: Self-pay | Admitting: Physical Therapy

## 2020-04-26 ENCOUNTER — Other Ambulatory Visit: Payer: Self-pay

## 2020-04-26 ENCOUNTER — Ambulatory Visit: Payer: Medicaid Other | Admitting: Physical Therapy

## 2020-04-26 DIAGNOSIS — M25612 Stiffness of left shoulder, not elsewhere classified: Secondary | ICD-10-CM

## 2020-04-26 DIAGNOSIS — M6281 Muscle weakness (generalized): Secondary | ICD-10-CM

## 2020-04-26 DIAGNOSIS — M5442 Lumbago with sciatica, left side: Secondary | ICD-10-CM | POA: Diagnosis not present

## 2020-04-26 DIAGNOSIS — G8911 Acute pain due to trauma: Secondary | ICD-10-CM

## 2020-04-26 DIAGNOSIS — R0781 Pleurodynia: Secondary | ICD-10-CM

## 2020-04-26 NOTE — Therapy (Signed)
[place], [place], [address] Phone: [phone]   Fax:  [phone]  Physical Therapy Treatment / Discharge  Patient Details  Name: Joshiah Traynham MRN: [id number] Date of Birth: [birth year] Referring Provider (PT): Eunice Blase, MD   Encounter Date: 04/26/2020   PT End of Session - 04/26/20 1102    Visit Number 12    Number of Visits 14    Date for PT Re-Evaluation 05/07/20    Authorization Type MCD    Authorization Time Period 03/27/2020 - 05/07/2020    Authorization - Visit Number 7    Authorization - Number of Visits 12    Progress Note Due on Visit 10    PT Start Time 1100    PT Stop Time 1130    PT Time Calculation (min) 30 min    Activity Tolerance Patient tolerated treatment well    Behavior During Therapy [facility] for tasks assessed/performed           Past Medical History:  Diagnosis Date  . Arthritis   . Back pain    chronic  . Bipolar 1 disorder ([place])   . Depression   . Diabetes mellitus without complication ([place])   . Hemorrhoids   . Hypertension     History reviewed. No pertinent surgical history.  There were no vitals filed for this visit.   Subjective Assessment - 04/26/20 1103    Subjective " i am doing pretty good today. I was reaching for something with my L arm and felt a pop and now I have better motion."    Patient Stated Goals For pain to get better, to have good use of L shoulder and back, and to be able to return to work.    Currently in Pain? Yes    Pain Score 6    at worst 8/10   Pain Location Shoulder    Pain Orientation Left    Pain Descriptors / Indicators Aching;Sore    Pain Type Chronic pain    Pain Onset More than a month ago    Pain Frequency Intermittent    Aggravating Factors  sitting, sleeping, certain positions.    Pain Relieving Factors meds, tigher    Pain Location Back    Aggravating Factors  standing up after bending forward               [facility] PT Assessment - 04/26/20 0001      Assessment   Medical Diagnosis Acute pain of left shoulder; Acute left-sided low back pain with left-sided sciatica; Rib pain on left side    Referring Provider (PT) Hilts, Michael, MD      AROM   Left Shoulder Flexion 130 Degrees    Left Shoulder ABduction 60 Degrees    Left Shoulder Internal Rotation --   L5   Left Shoulder External Rotation --   C7     Strength   Left Shoulder Flexion 4-/5    Left Shoulder Extension 5/5    Left Shoulder ABduction 4-/5   pain during testing   Left Shoulder Internal Rotation 4-/5    Left Shoulder External Rotation 4-/5                         [facility] Adult PT Treatment/Exercise - 04/26/20 0001      Self-Care   Self-Care Other Self-Care Comments    Other Self-Care Comments  how to perofrm self trigger point release and tools  that can assist with treatment.                  PT Education - 04/26/20 1132    Education Details Reviewed HEP and updated today. provided additional bands for strength progressiong. discussed appropriate progression of strengtheing by gradually increasing reps/ sets before adding resistance. how to perform self trigger point release addressed pt questions.    Person(s) Educated Patient    Methods Explanation;Verbal cues;Handout    Comprehension Verbalized understanding;Verbal cues required            PT Short Term Goals - 03/12/20 0952      PT SHORT TERM GOAL #1   Title Pt will be Ind in an initial HEP    Baseline Patient was I with initial HEP, progressing toward LTG final HEP    Status Achieved    Target Date 03/05/20      PT SHORT TERM GOAL #2   Title Pt will voice understanding of measures to assist with the reduction of L shoulder pain    Status Achieved             PT Long Term Goals - 04/26/20 1114      PT LONG TERM GOAL #1   Title Increase L shoulder strength to 4+/5 for improved functional use 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    Period Weeks    Status Not Met      PT LONG TERM GOAL #3   Title Improve L shoulder pain to 4/10 or less for ADLs and work related activities    Period Weeks    Status Not Met      PT LONG TERM GOAL #4   Title Pt will be Ind in a final HEP to maintain or progress achieved LOF.    Period Weeks    Status On-going      PT LONG TERM GOAL #5   Title Patient will report </= 4/10 lower back pain with activies such as brushing teeth in morning to reduce functional limitations    Period Weeks    Status Not Met                 Plan - 04/26/20 1133    Clinical Impression Statement pt is making progress with physical therapy noting improvement in shoulder mobility and back / shoulder pain. He does continue to demonstrate limited shoulder ROM and weakness. pt is motivated to conintinued strengthening for shoulder and back at home and is consistent with his HEP. pt is able to maintain and progress his current LOF IND and will be discharged from PT today.    PT Treatment/Interventions ADLs/Self Care Home Management;Cryotherapy;Electrical Stimulation;Moist Heat;Iontophoresis 34m/ml Dexamethasone;Therapeutic activities;Therapeutic exercise;Manual techniques;Patient/family education;Passive range of motion;Dry needling;Taping;Joint Manipulations    PT Next Visit Plan D/C    PT Home Exercise Plan [email or web address]           Patient will benefit from skilled therapeutic intervention in order to improve the following deficits and impairments:  Decreased mobility,Decreased strength,Pain,Impaired UE functional use  Visit Diagnosis: Acute left-sided low back pain with left-sided sciatica  Rib pain on left side  Muscle weakness (generalized)  Decreased ROM of left shoulder  Acute  pain of left shoulder  due to trauma     Problem List Patient Active Problem List   Diagnosis Date Noted  . Pemphigus vulgaris 06/23/2019  . Mucosal irritation of oral cavity 04/13/2019  . Epistaxis, recurrent 02/13/2019  . Pharyngitis 02/13/2019  . Internal hemorrhoid, bleeding 09/20/2012   Starr Lake PT, DPT, LAT, ATC  04/26/20  11:36 AM      [facility] [address] [place], [place], [address] Phone: [phone]   Fax:  [phone]  Name: Lamark Schue MRN: [id number] Date of Birth: 1965/02/21      PHYSICAL THERAPY DISCHARGE SUMMARY  Visits from Start of Care: 12  Current functional level related to goals / functional outcomes: See goals   Remaining deficits: See assessment    Education / Equipment: HEP, theraband, posture, self trigger point release.   Plan: Patient agrees to discharge.  Patient goals were partially met. Patient is being discharged due to being pleased with the current functional level.  ?????

## 2020-04-26 NOTE — Patient Instructions (Signed)
Access Code: [email or web address] URL: [email or web address] Date: 04/26/2020 Prepared by: Lulu Riding  Exercises Gentle Levator Scapulae Stretch - 2-3 x daily - 7 x weekly - 2 sets - 10 reps - 20 hold Gentle Upper Trap Stretch - 2-3 x daily - 7 x weekly - 2 reps - 30 seconds hold Shoulder Flexion Wall Slide with Towel - 1-2 x daily - 7 x weekly - 2 sets - 10 reps Sidelying Shoulder External Rotation - 1-2 x daily - 7 x weekly - 2 sets - 15 reps Sidelying Shoulder Abduction Palm Forward - 1-2 x daily - 7 x weekly - 2 sets - 10 reps Supine Lower Trunk Rotation - 1-2 x daily - 7 x weekly - 10 reps - 5 hold Hooklying Single Knee to Chest Stretch - 1-2 x daily - 7 x weekly - 2 reps - 30 hold Supine Piriformis Stretch with Foot on Ground - 1-2 x daily - 7 x weekly - 2 reps - 30 hold Supine Pelvic Tilt - 1-2 x daily - 7 x weekly - 10 reps - 5 seconds hold Hooklying Clamshell with Resistance - 1 x daily - 7 x weekly - 2 sets - 15 reps - 3 hold Supine March with Resistance Band - 1 x daily - 7 x weekly - 2 sets - 20 reps Supine Bridge - 1 x daily - 7 x weekly - 2 sets - 10 reps - 3 hold Shoulder External Rotation - 1 x daily - 7 x weekly - 2 sets - 10 reps Shoulder Internal Rotation - 1 x daily - 7 x weekly - 2 sets - 10 reps Banded Row - 1-2 x daily - 7 x weekly - 2 sets - 15 reps

## 2020-07-18 ENCOUNTER — Encounter: Payer: Self-pay | Admitting: Orthopedic Surgery

## 2020-07-18 ENCOUNTER — Ambulatory Visit (INDEPENDENT_AMBULATORY_CARE_PROVIDER_SITE_OTHER): Payer: Medicaid Other | Admitting: Orthopedic Surgery

## 2020-07-18 ENCOUNTER — Other Ambulatory Visit: Payer: Self-pay

## 2020-07-18 DIAGNOSIS — M79602 Pain in left arm: Secondary | ICD-10-CM

## 2020-07-18 DIAGNOSIS — S46012A Strain of muscle(s) and tendon(s) of the rotator cuff of left shoulder, initial encounter: Secondary | ICD-10-CM | POA: Diagnosis not present

## 2020-07-18 MED ORDER — BUPIVACAINE HCL 0.5 % IJ SOLN
9.0000 mL | INTRAMUSCULAR | Status: AC | PRN
  Administered 2020-07-18: 9 mL via INTRA_ARTICULAR

## 2020-07-18 MED ORDER — LIDOCAINE HCL 1 % IJ SOLN
5.0000 mL | INTRAMUSCULAR | Status: AC | PRN
  Administered 2020-07-18: 5 mL

## 2020-07-18 NOTE — Progress Notes (Signed)
Office Visit Note   Patient: Robert Hooper           Date of Birth: 04/24/1965           MRN: [id number] Visit Date: 07/18/2020 Requested by: [name], MD [address] [place],  [place] [address] PCP: [name], MD  Subjective: Chief Complaint  Patient presents with   Left Shoulder - Follow-up    HPI: Robert Hooper is a 55 y.o. male who presents to the office complaining of left shoulder pain.  Patient returns for 64-month follow-up visit.  He complains of continued left shoulder pain but reports that the Toradol injection at his last appointment provided 40% relief of his pain.  Additionally, he reports shoulder pain that travels down the entirety of his left arm and has associated numbness and tingling in all 5 fingers of the left hand.  No right-sided symptoms.  He notices this numbness and tingling every day multiple times per day.  No history of neck surgery.  He has associated neck pain on the left-hand side as well as left shoulder blade pain.  He has been doing a home exercise program, focusing on strengthening his shoulder and he feels this is helping but he does have continued difficulty with daily activities such as putting his clothes on lifting things overhead and mopping.  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                ROS: All systems reviewed are negative as they relate to the chief complaint within the history of present illness.  Patient denies fevers or chills.  Assessment & Plan: Visit Diagnoses:  1. Left arm pain     Plan: Patient is a 55 year old male who returns for reevaluation of left shoulder pain.  He did have relief from injection at last visit but this only provided 40% leaf.  Additionally, today he is complaining of more radicular type symptoms.  No recent MRI of the cervical spine but he did have CT of the cervical spine from October 2019 that showed osteophyte formation with degeneration at multiple levels.   With worsening radicular pain and numbness and tingling in the left hand, plan to order MRI of the cervical spine for further evaluation of left radiculopathy.  Also, left subacromial Toradol injection was administered and patient tolerated procedure well.  Plan to follow-up after MRI to review results.  Follow-Up Instructions: No follow-ups on file.   Orders:  Orders Placed This Encounter  Procedures   MR Cervical Spine w/o contrast   No orders of the defined types were placed in this encounter.     Procedures: Large Joint Inj: L subacromial bursa on 07/18/2020 4:42 PM Indications: diagnostic evaluation and pain Details: 18 G 1.5 in needle, posterior approach  Arthrogram: No  Medications: 5 mL lidocaine 1 %; 9 mL bupivacaine 0.5 % (Toradol with bupivocaine) Outcome: tolerated well, no immediate complications Procedure, treatment alternatives, risks and benefits explained, specific risks discussed. Consent was given by the patient. Immediately prior to procedure a time out was called to verify the correct patient, procedure, equipment, support staff and site/side marked as required. Patient was prepped and draped in the usual sterile fashion.      Clinical Data: No additional findings.  Objective: Vital Signs: There were no vitals taken for this visit.  Physical Exam:  Constitutional: Patient appears well-developed HEENT:  Head: Normocephalic Eyes:EOM are normal Neck: Normal range of motion Cardiovascular: Normal rate Pulmonary/chest: Effort  normal Neurologic: Patient is alert Skin: Skin is warm Psychiatric: Patient has normal mood and affect  Ortho Exam: Ortho exam demonstrates left shoulder with 60 degrees external rotation, 95 degrees abduction, 165 degrees forward flexion.  Weakness with infraspinatus resistance testing, positive Hornblower sign.  Active motion equivalent to passive motion.  Weakness with supraspinatus resistance testing that seems mostly secondary to  pain.  Same for the subscapularis.  Axillary nerve intact with deltoid firing.  5/5 motor strength of bilateral grip strength, finger abduction, pronation/supination, bicep, tricep, deltoid.  Mild tenderness throughout the axial cervical spine.  Increased pain with patient looking to his left.  Negative Spurling sign.  Specialty Comments:  No specialty comments available.  Imaging: No results found.   PMFS History: Patient Active Problem List   Diagnosis Date Noted   Pemphigus vulgaris 06/23/2019   Mucosal irritation of oral cavity 04/13/2019   Epistaxis, recurrent 02/13/2019   Pharyngitis 02/13/2019   Internal hemorrhoid, bleeding 09/20/2012   Past Medical History:  Diagnosis Date   Arthritis    Back pain    chronic   Bipolar 1 disorder (HCC)    Depression    Diabetes mellitus without complication (HCC)    Hemorrhoids    Hypertension     No family history on file.  No past surgical history on file. Social History   Occupational History   Not on file  Tobacco Use   Smoking status: Never   Smokeless tobacco: Never  Substance and Sexual Activity   Alcohol use: No   Drug use: No   Sexual activity: Yes

## 2020-08-01 ENCOUNTER — Ambulatory Visit
Admission: RE | Admit: 2020-08-01 | Discharge: 2020-08-01 | Disposition: A | Discharge status: Home / Self Care | Payer: Medicaid Other | Source: Ambulatory Visit | Attending: Orthopedic Surgery | Admitting: Orthopedic Surgery

## 2020-08-01 ENCOUNTER — Other Ambulatory Visit: Payer: Self-pay

## 2020-08-01 DIAGNOSIS — M79602 Pain in left arm: Secondary | ICD-10-CM

## 2020-08-01 IMAGING — MR MR CERVICAL SPINE W/O CM
5 series · 36 of 48 positions shown · non-contrast
Comparison: CT of the cervical spine [DATE].

CLINICAL DATA: Left radicular arm pain.

EXAM:
MRI CERVICAL SPINE WITHOUT CONTRAST
TECHNIQUE: Multiplanar, multisequence MR imaging of the cervical spine was
performed. No intravenous contrast was administered.

[Series 2: T2 · sagittal · 3.0mm · 0.41mm/px · 8 of 17 slices shown (1 of 2)]
[im 1/17]
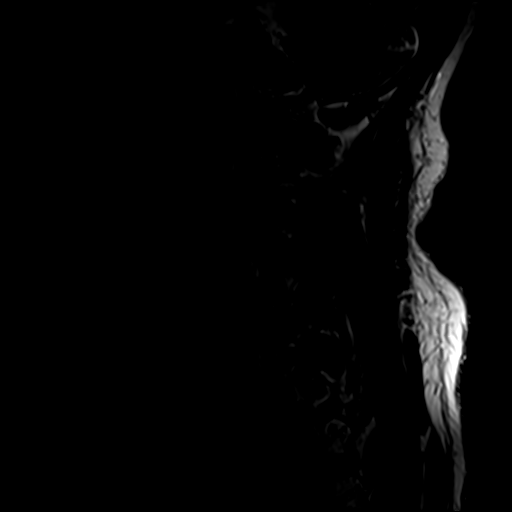
[im 3/17]
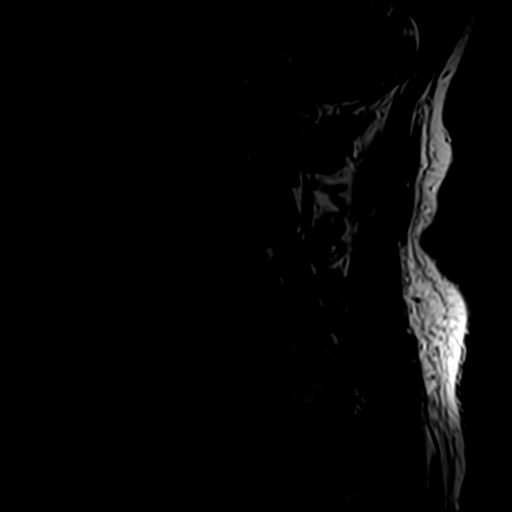
[im 5/17]
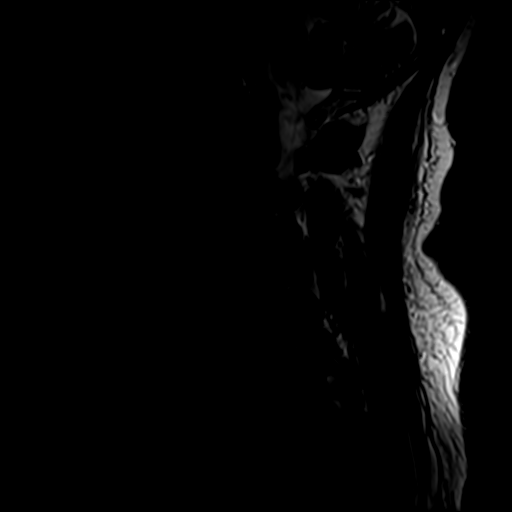
[im 7/17]
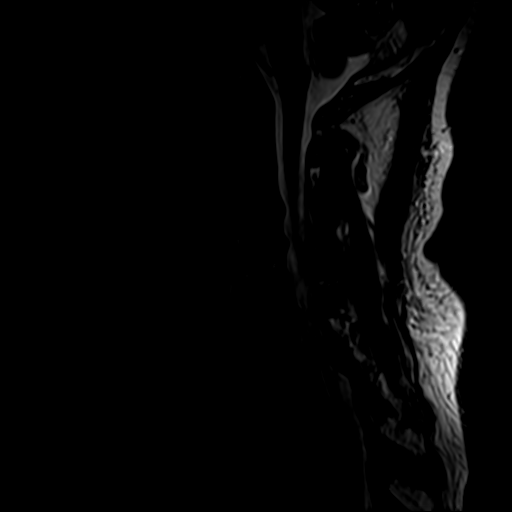
[im 10/17]
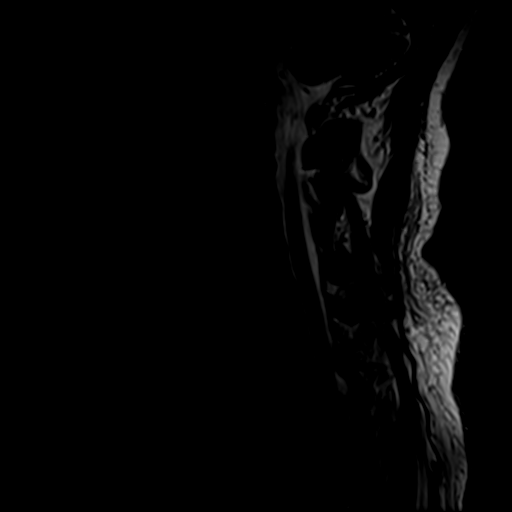
[im 12/17]
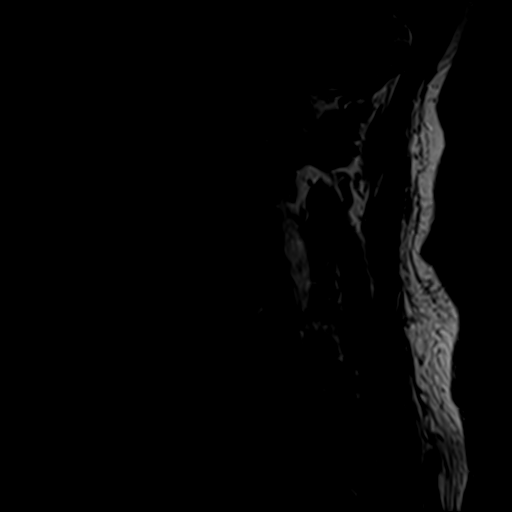
[im 14/17]
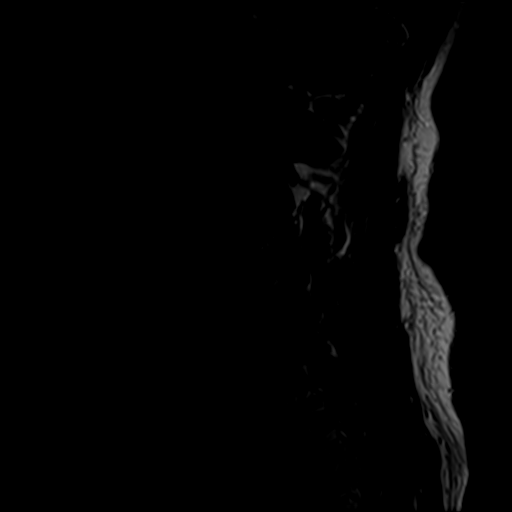
[im 17/17]
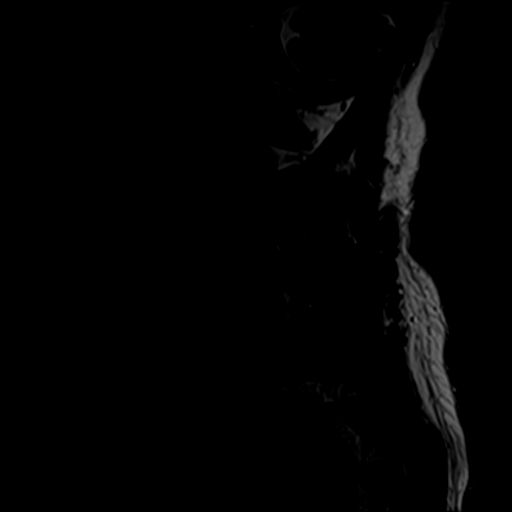

[Series 3: STIR · sagittal · 3.0mm · 0.82mm/px · 8 of 17 slices shown]
[im 1/17]
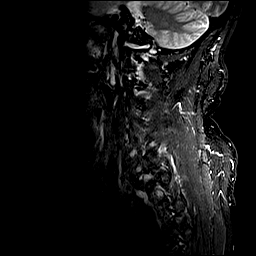
[im 3/17]
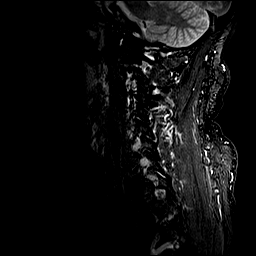
[im 5/17]
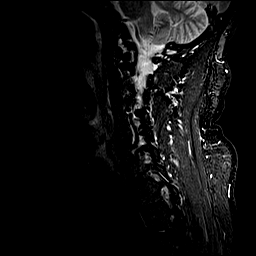
[im 7/17]
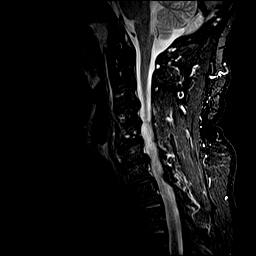
[im 10/17]
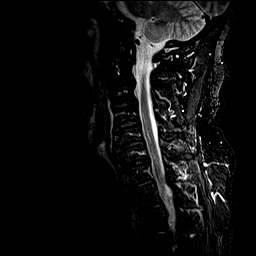
[im 12/17]
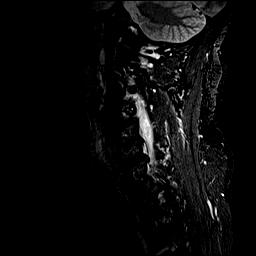
[im 14/17]
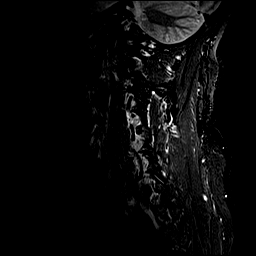
[im 17/17]
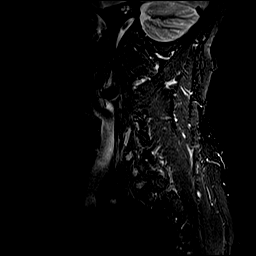

[Series 4: T1 · sagittal · 3.0mm · 0.82mm/px · 8 of 17 slices shown]
[im 1/17]
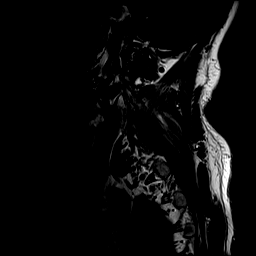
[im 3/17]
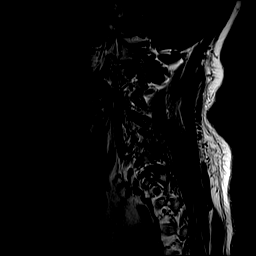
[im 5/17]
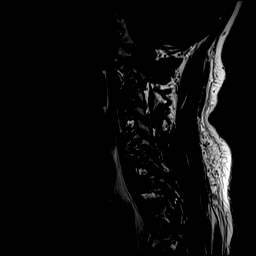
[im 7/17]
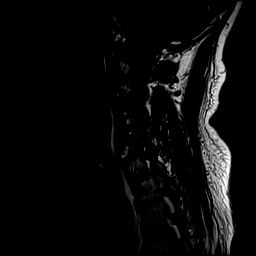
[im 10/17]
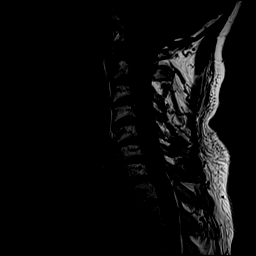
[im 12/17]
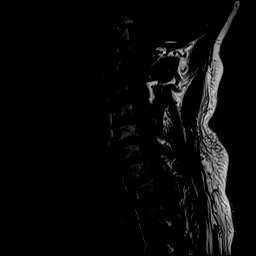
[im 14/17]
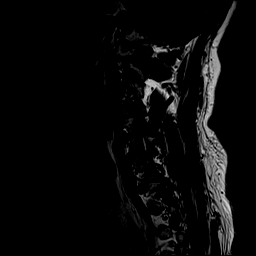
[im 17/17]
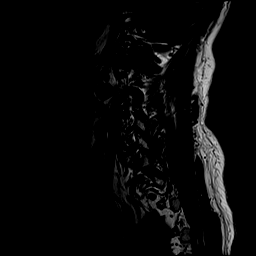

[Series 5: T2 · axial · 3.0mm · 0.70mm/px · z∈[-44,+46]mm · 9 of 26 slices shown (2 of 2)]
[im 1/26]
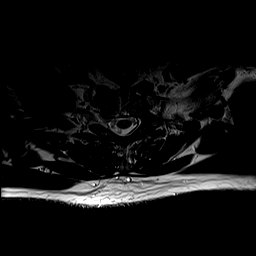
[im 5/26]
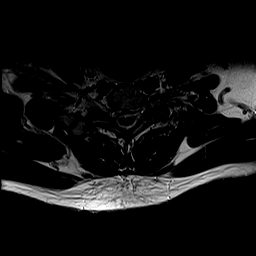
[im 7/26]
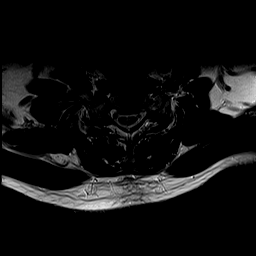
[im 12/26]
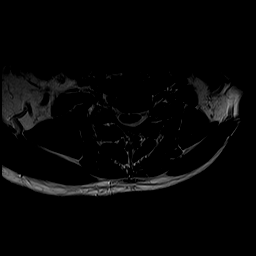
[im 14/26]
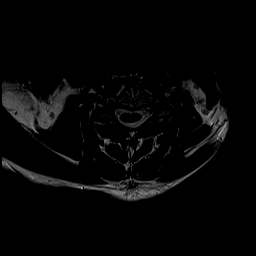
[im 19/26]
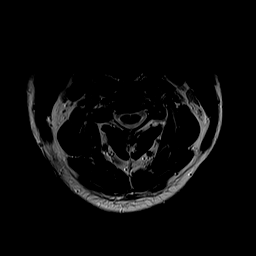
[im 21/26]
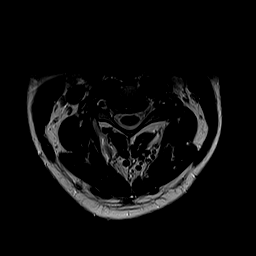
[im 23/26]
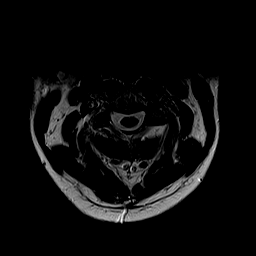
[im 26/26]
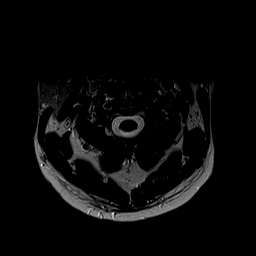

[Series 6: GRE · axial · 3.0mm · 0.35mm/px · z∈[-44,-23]mm · 3 of 26 slices shown]
[im 1/26]
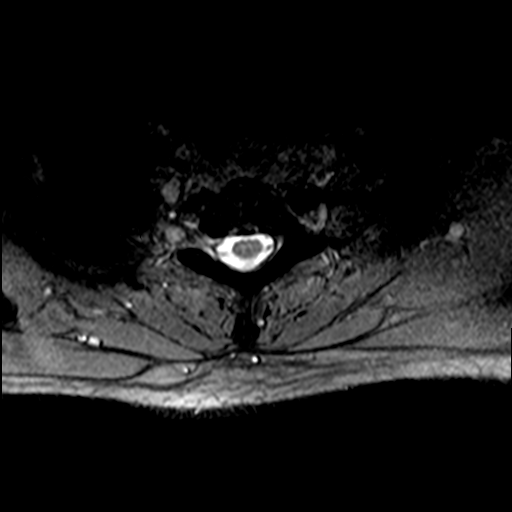
[im 5/26]
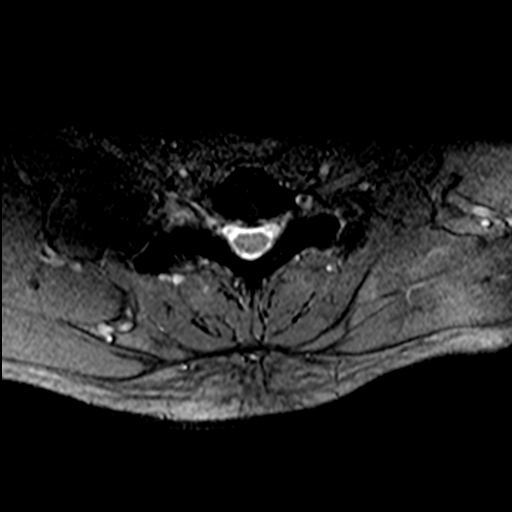
[im 7/26]
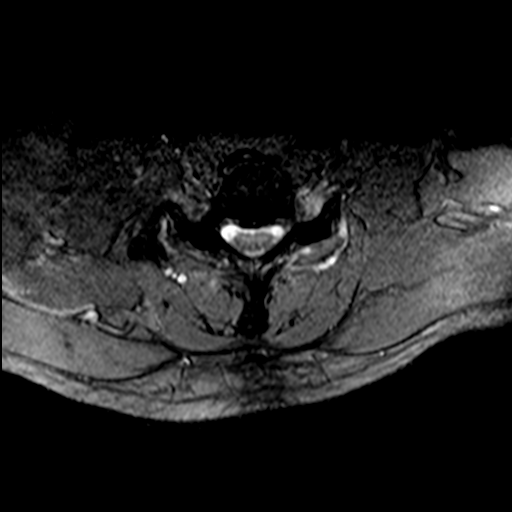

[36 of 48 positions shown; findings below may reference images not displayed]

FINDINGS: Alignment: Straightening of the cervical curvature.

Vertebrae: No fracture, evidence of discitis, or bone lesion.

Cord: Normal signal and morphology.

Posterior Fossa, vertebral arteries, paraspinal tissues: Negative.

Disc levels:

C2-3: Left facet degenerative changes resulting in mild left neural
foraminal narrowing. No spinal canal stenosis.

C3-4: Small posterior disc protrusion without significant spinal
canal stenosis. Uncovertebral degenerative changes resulting in mild
right neural foraminal narrowing.

C4-5: Small posterior disc protrusion without significant spinal
canal stenosis. Uncovertebral and facet degenerative changes
resulting in moderate right neural foraminal narrowing.

C5-6: Small posterior disc protrusion without significant spinal
canal stenosis. Uncovertebral and facet degenerative changes
resulting in mild left neural foraminal narrowing.

C6-7: Small posterior disc protrusion without significant spinal
canal stenosis. Uncovertebral and facet degenerative changes
resulting in moderate left neural foraminal narrowing.

C7-T1: Facet degenerative changes resulting in moderate left neural
foraminal narrowing. No spinal canal stenosis.
IMPRESSION: 1. Degenerative changes of the cervical spine with multilevel neural
foraminal narrowing moderate on the right side at C4-5 and on the
left at C6-7 and C7-T1.
2. No high-grade spinal canal stenosis.

## 2020-08-21 ENCOUNTER — Ambulatory Visit: Payer: Medicaid Other | Admitting: Orthopedic Surgery

## 2020-09-03 ENCOUNTER — Encounter (INDEPENDENT_AMBULATORY_CARE_PROVIDER_SITE_OTHER): Payer: Medicaid Other | Admitting: Ophthalmology

## 2020-09-04 ENCOUNTER — Telehealth: Payer: Self-pay

## 2020-09-04 ENCOUNTER — Ambulatory Visit (INDEPENDENT_AMBULATORY_CARE_PROVIDER_SITE_OTHER): Payer: Medicaid Other | Admitting: Orthopedic Surgery

## 2020-09-04 ENCOUNTER — Other Ambulatory Visit: Payer: Self-pay

## 2020-09-04 DIAGNOSIS — M79602 Pain in left arm: Secondary | ICD-10-CM | POA: Diagnosis not present

## 2020-09-04 NOTE — Telephone Encounter (Signed)
Mail copy of OV note to patient once available from 09/04/20 OV

## 2020-09-05 ENCOUNTER — Encounter: Payer: Self-pay | Admitting: Orthopedic Surgery

## 2020-09-05 NOTE — Progress Notes (Signed)
Office Visit Note   Patient: Robert Hooper           Date of Birth: October 21, 1965           MRN: [id number] Visit Date: 09/04/2020 Requested by: [name], MD [address] [place],  [place] [address] PCP: [name], MD  Subjective: Chief Complaint  Patient presents with   Other    Scan review     HPI: Robert Hooper is a 55 year old patient here to review MRI scan of his cervical spine.  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  He still has pretty reasonable shoulder function.  He is having some pain but also radicular symptoms down to the dorsal aspect of the left hand consistent with radiculopathy.  MRI scan is reviewed with the patient and it does show left-sided C7-T1 foraminal stenosis.  This does not look necessarily like an operative problem at this time.  Overall he is slightly better compared to where he was last clinic visit in terms of clinical symptoms              ROS: All systems reviewed are negative as they relate to the chief complaint within the history of present illness.  Patient denies  fevers or chills.   Assessment & Plan: Visit Diagnoses:  1. Left arm pain     Plan: Impression is left shoulder rotator cuff arthropathy which is stable.  He does have some weakness with overhead motion but is able to forward flex and AB duct his arm above 90 degrees.  I think a majority of his radicular arm pain is coming from his foraminal stenosis.  We talked about cervical spine injection as a first step towards diminishing his left arm symptoms but he wants to hold off on that for now.  Should his symptoms worsen he should call me and we will get him set up with injection with Dr. Alvester Morin.  Continue with over-the-counter anti-inflammatories as needed along with over-the-counter pain relievers.  Follow-up as needed.  Follow-Up Instructions: Return if symptoms worsen or fail to improve.   Orders:  No orders of the defined types were placed in  this encounter.  No orders of the defined types were placed in this encounter.     Procedures: No procedures performed   Clinical Data: No additional findings.  Objective: Vital Signs: There were no vitals taken for this visit.  Physical Exam:   Constitutional: Patient appears well-developed HEENT:  Head: Normocephalic Eyes:EOM are normal Neck: Normal range of motion Cardiovascular: Normal rate Pulmonary/chest: Effort normal Neurologic: Patient is alert Skin: Skin is warm Psychiatric: Patient has normal mood and affect   Ortho Exam: Ortho exam demonstrates forward flexion and abduction on the left arm above 90 degrees.  He does have some rotator cuff weakness consistent with his known posterior superior rotator cuff deficiency.  He has mild paresthesias in the C7 T8 distribution left versus right but no muscle atrophy in the arm.  Shoulder is located.  Deltoid is functional.  Radial pulse intact bilaterally.  No other masses lymphadenopathy or skin changes noted in that shoulder region.  Specialty Comments:  No specialty comments available.  Imaging: No results found.   PMFS History: Patient Active Problem List   Diagnosis Date Noted   Pemphigus vulgaris 06/23/2019   Mucosal irritation of oral cavity 04/13/2019   Epistaxis, recurrent 02/13/2019   Pharyngitis 02/13/2019   Internal hemorrhoid, bleeding 09/20/2012   Past Medical History:  Diagnosis Date  Arthritis    Back pain    chronic   Bipolar 1 disorder (HCC)    Depression    Diabetes mellitus without complication (HCC)    Hemorrhoids    Hypertension     History reviewed. No pertinent family history.  History reviewed. No pertinent surgical history. Social History   Occupational History   Not on file  Tobacco Use   Smoking status: Never   Smokeless tobacco: Never  Substance and Sexual Activity   Alcohol use: No   Drug use: No   Sexual activity: Yes

## 2020-09-05 NOTE — Telephone Encounter (Signed)
mailed

## 2020-09-27 ENCOUNTER — Other Ambulatory Visit: Payer: Self-pay

## 2020-09-27 ENCOUNTER — Encounter (HOSPITAL_COMMUNITY): Payer: Self-pay

## 2020-09-27 ENCOUNTER — Emergency Department (HOSPITAL_COMMUNITY): Payer: Medicaid Other

## 2020-09-27 ENCOUNTER — Emergency Department (HOSPITAL_COMMUNITY)
Admission: EM | Admit: 2020-09-27 | Discharge: 2020-09-27 | Disposition: A | Discharge status: Home / Self Care | Payer: Medicaid Other | Attending: Emergency Medicine | Admitting: Emergency Medicine

## 2020-09-27 DIAGNOSIS — Z7984 Long term (current) use of oral hypoglycemic drugs: Secondary | ICD-10-CM | POA: Insufficient documentation

## 2020-09-27 DIAGNOSIS — E119 Type 2 diabetes mellitus without complications: Secondary | ICD-10-CM | POA: Insufficient documentation

## 2020-09-27 DIAGNOSIS — S6991XA Unspecified injury of right wrist, hand and finger(s), initial encounter: Secondary | ICD-10-CM | POA: Diagnosis present

## 2020-09-27 DIAGNOSIS — I10 Essential (primary) hypertension: Secondary | ICD-10-CM | POA: Insufficient documentation

## 2020-09-27 DIAGNOSIS — W231XXA Caught, crushed, jammed, or pinched between stationary objects, initial encounter: Secondary | ICD-10-CM | POA: Insufficient documentation

## 2020-09-27 DIAGNOSIS — M25531 Pain in right wrist: Secondary | ICD-10-CM | POA: Diagnosis not present

## 2020-09-27 DIAGNOSIS — Z79899 Other long term (current) drug therapy: Secondary | ICD-10-CM | POA: Diagnosis not present

## 2020-09-27 IMAGING — DX DG HAND COMPLETE 3+V*R*
3 series · 3 of 3 positions shown · non-contrast
Comparison: [DATE]

CLINICAL DATA: Closed arm in car door

EXAM:
RIGHT WRIST - COMPLETE 3+ VIEW; RIGHT HAND - COMPLETE 3+ VIEW

[hand pa]
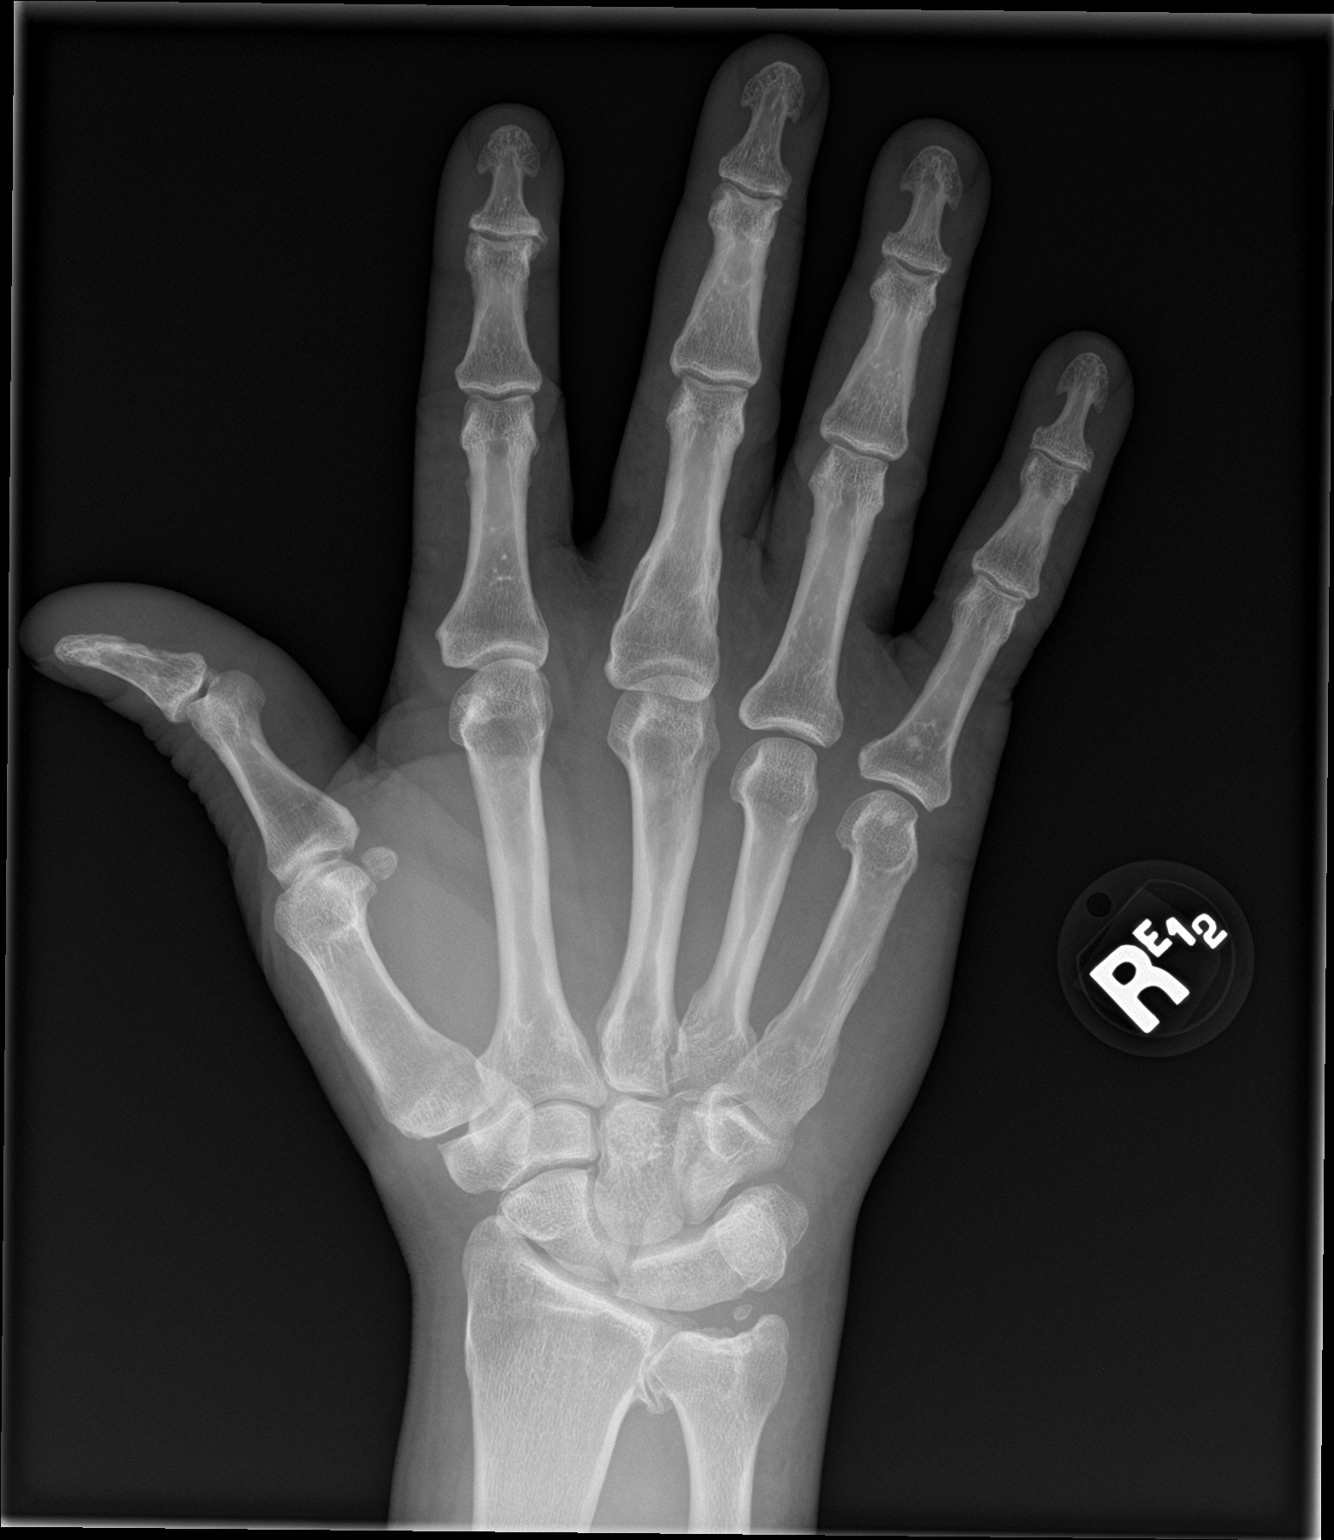

[hand obl]
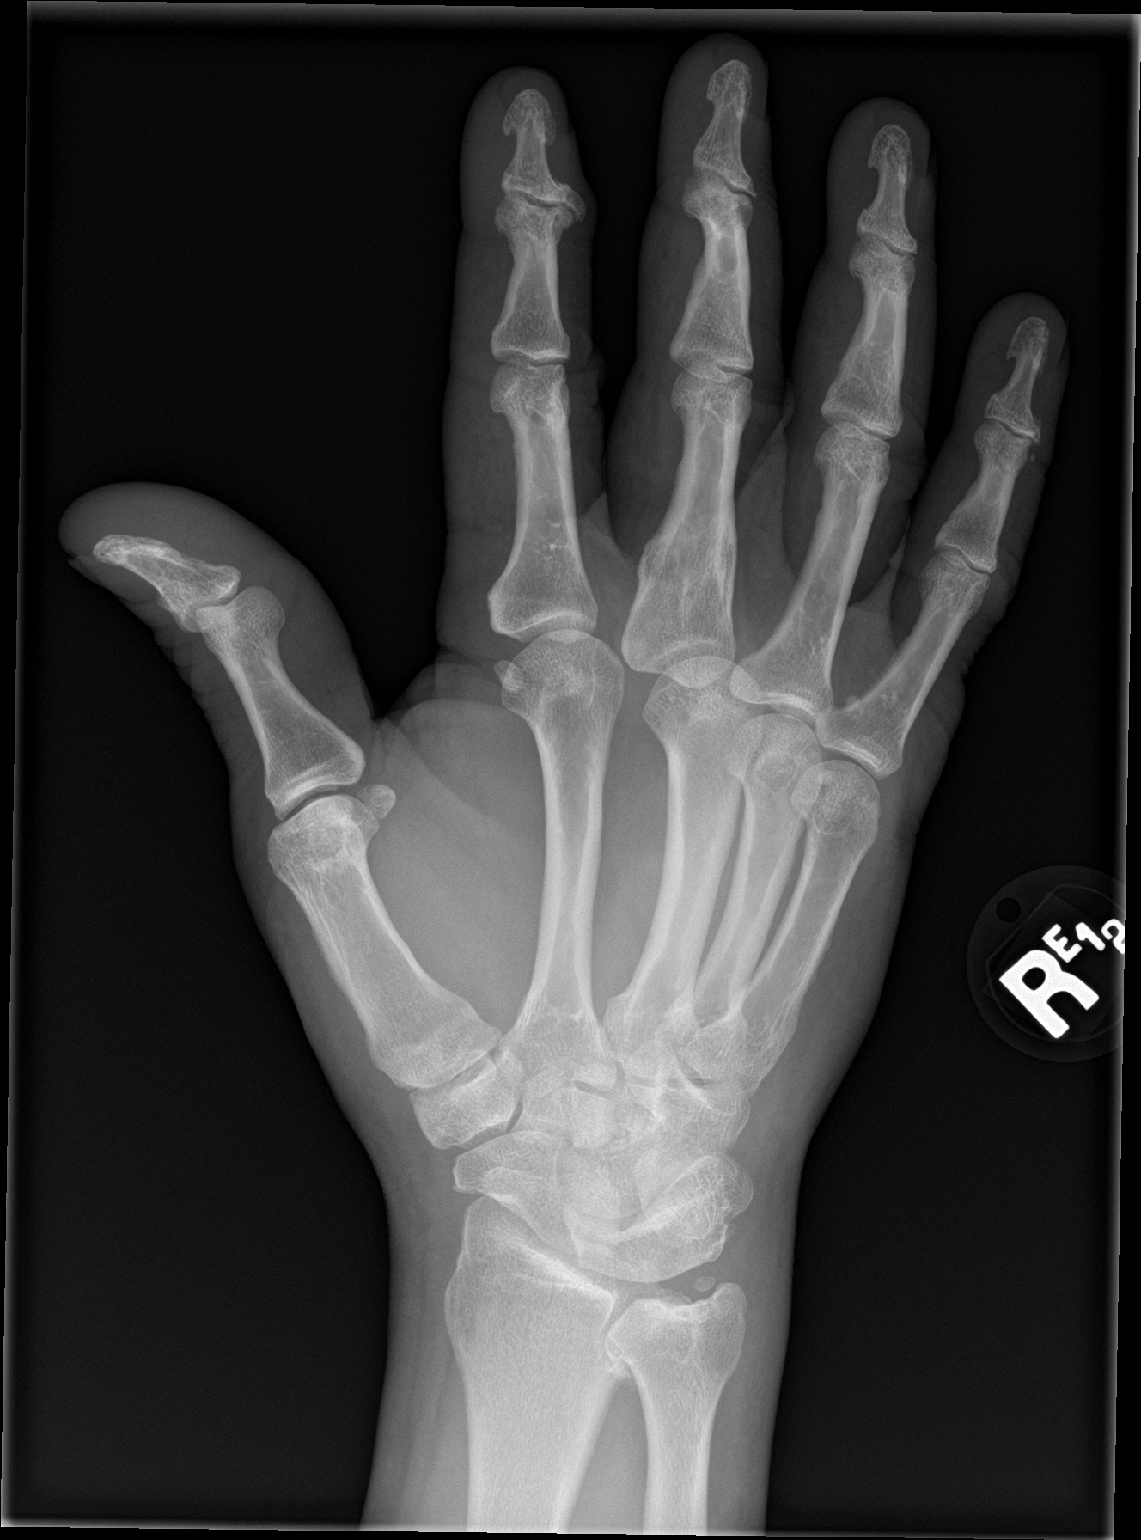

[hand lat]
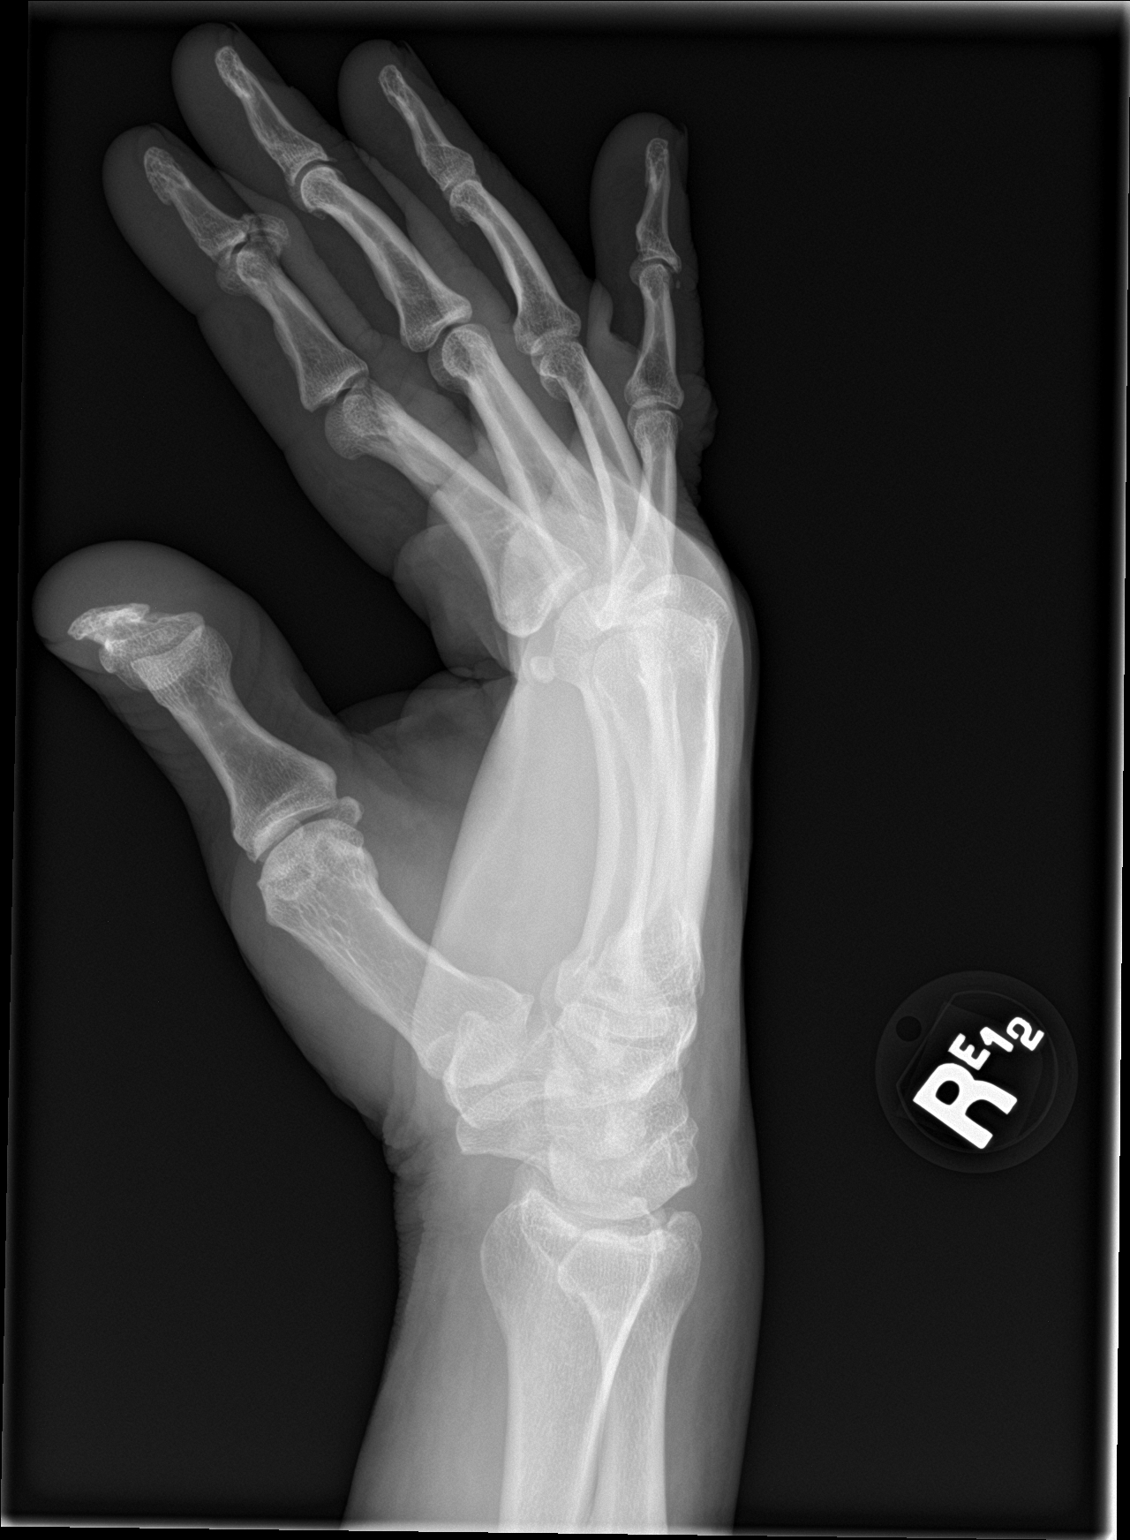

[3 of 3 positions shown; findings below may reference images not displayed]

FINDINGS: Right wrist: Frontal, oblique, lateral, and ulnar deviated views of
the right wrist are obtained. Congenital lunotriquetral coalition
again noted, a frequent anatomic variant. Stable osteoarthritic
changes of the distal radioulnar joint. No acute fracture,
subluxation, or dislocation. Stable healed distal right ulnar
fracture. Soft tissues are unremarkable.

Right Hand: Frontal, oblique, and lateral views are obtained. There
are no acute displaced fractures. Prominent osteoarthritis of the
second distal interphalangeal joint, which may be related to chronic
trauma. Prior healed third proximal phalangeal fracture. Soft
tissues are unremarkable.
IMPRESSION: 1. No acute bony abnormality.
2. Osteoarthritis of the distal radioulnar joint and second distal
phalanx.

## 2020-09-27 IMAGING — DX DG WRIST COMPLETE 3+V*R*
4 series · 4 of 4 positions shown · non-contrast
Comparison: [DATE]

CLINICAL DATA: Closed arm in car door

EXAM:
RIGHT WRIST - COMPLETE 3+ VIEW; RIGHT HAND - COMPLETE 3+ VIEW

[wrist pa]
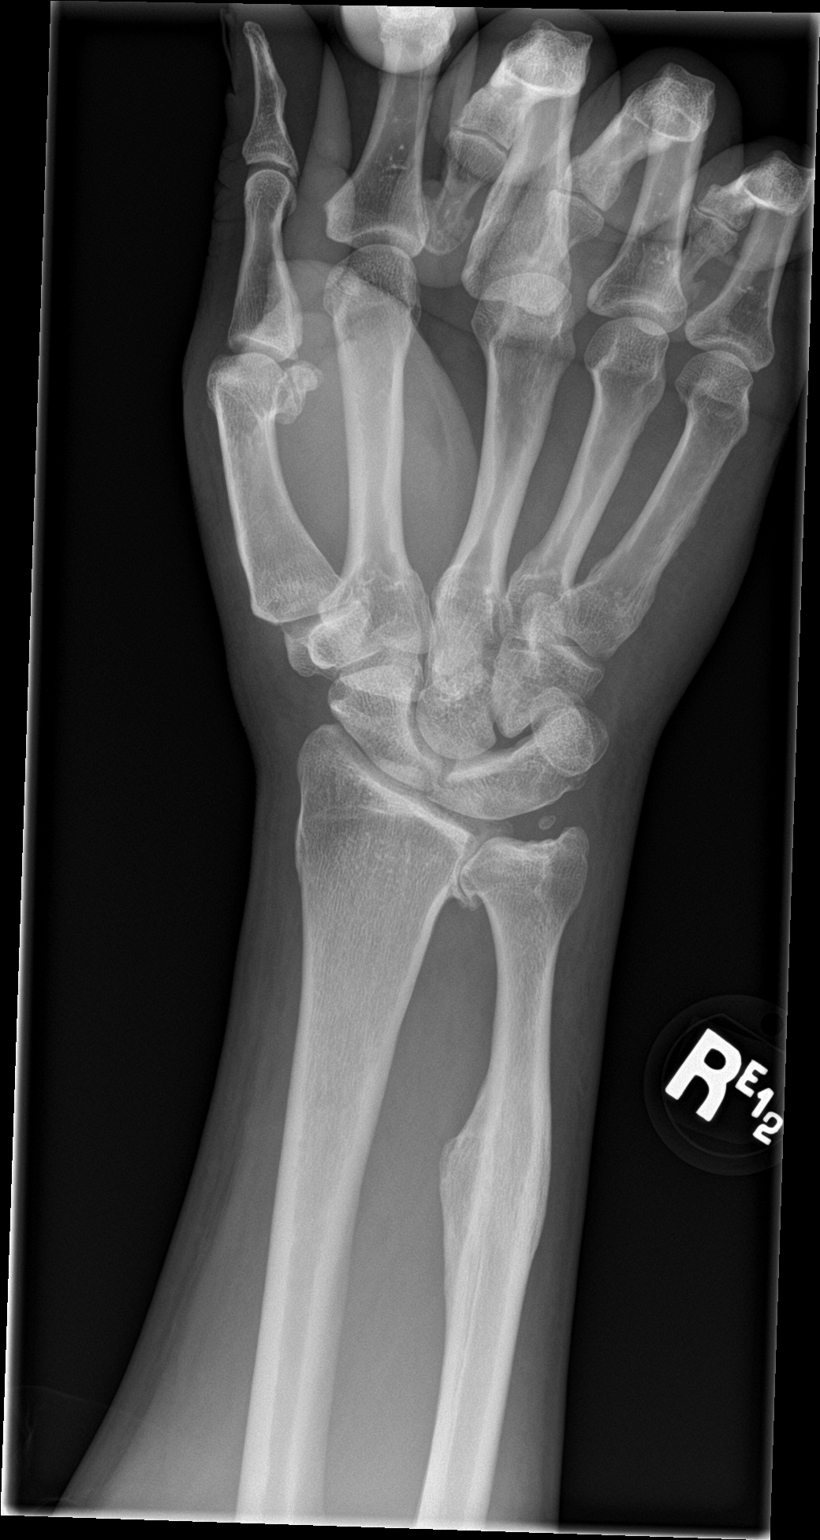

[wrist obl]
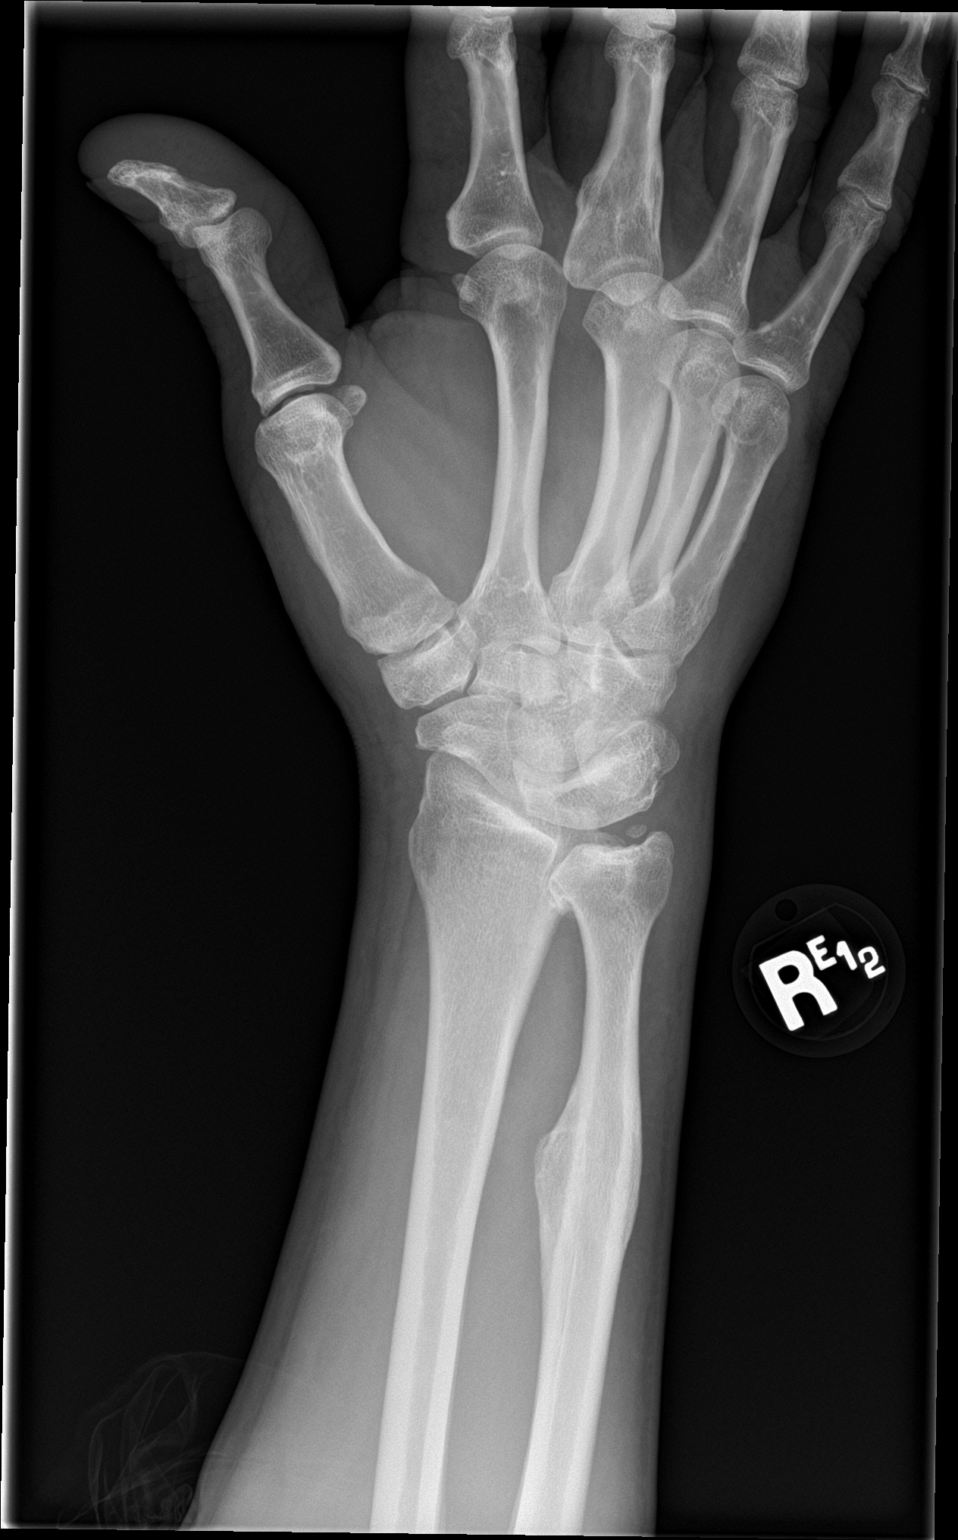

[wrist lat]
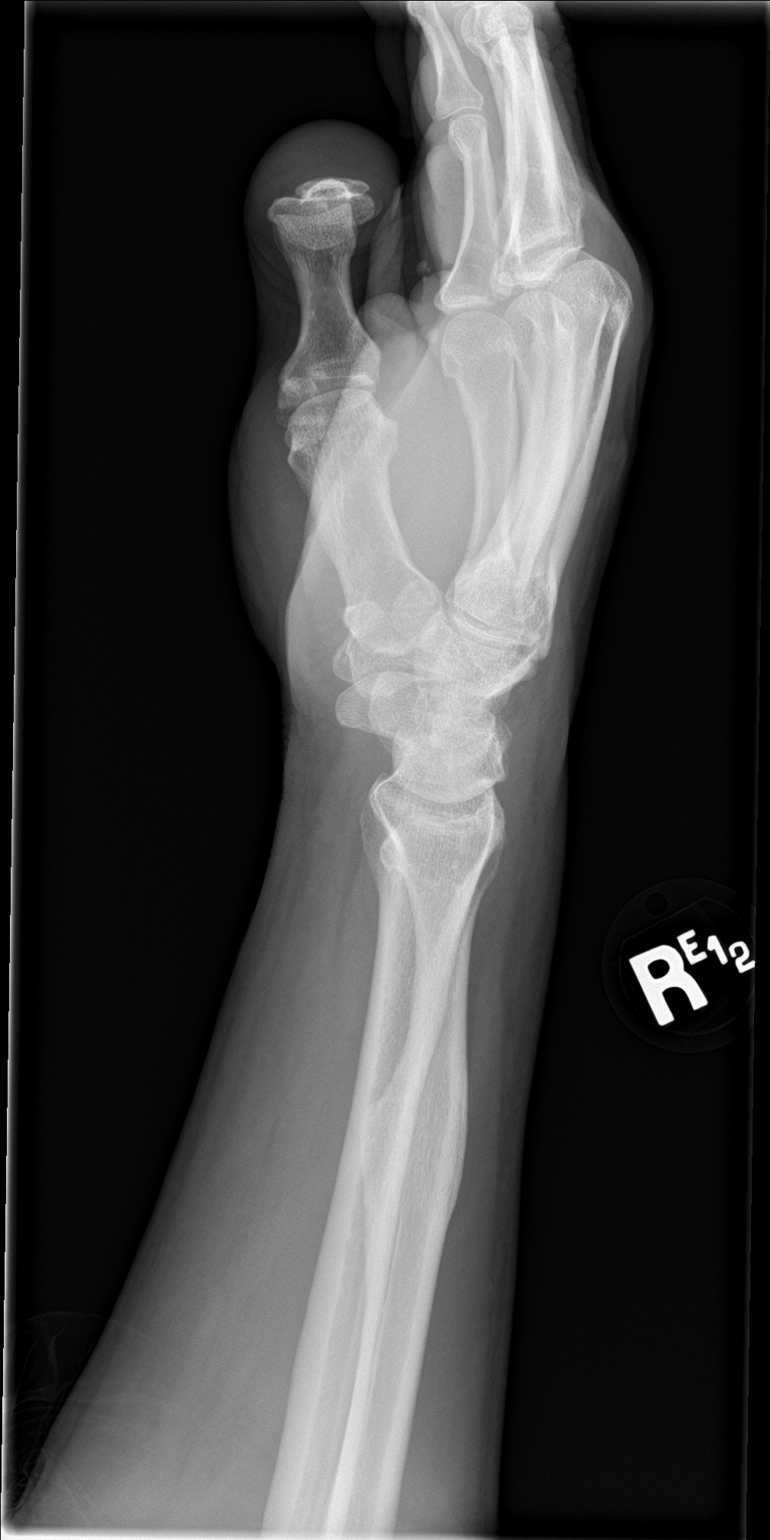

[wrist navicular]
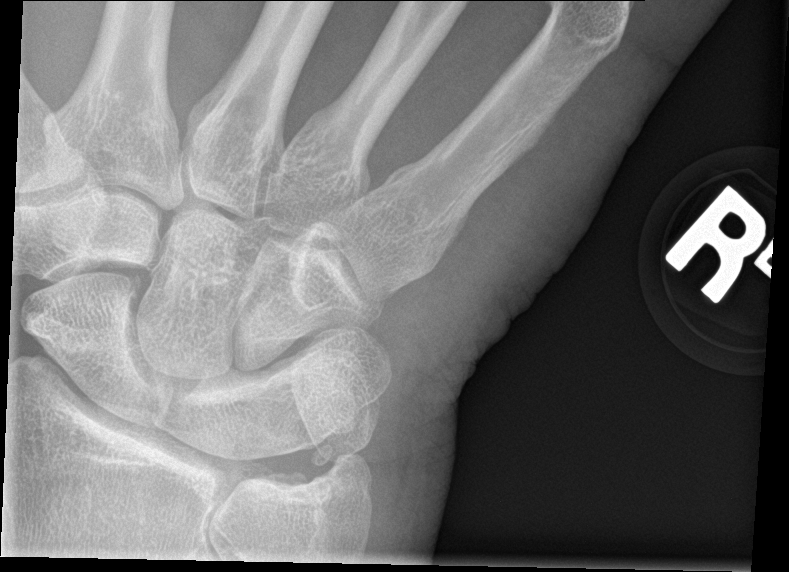

[4 of 4 positions shown; findings below may reference images not displayed]

FINDINGS: Right wrist: Frontal, oblique, lateral, and ulnar deviated views of
the right wrist are obtained. Congenital lunotriquetral coalition
again noted, a frequent anatomic variant. Stable osteoarthritic
changes of the distal radioulnar joint. No acute fracture,
subluxation, or dislocation. Stable healed distal right ulnar
fracture. Soft tissues are unremarkable.

Right Hand: Frontal, oblique, and lateral views are obtained. There
are no acute displaced fractures. Prominent osteoarthritis of the
second distal interphalangeal joint, which may be related to chronic
trauma. Prior healed third proximal phalangeal fracture. Soft
tissues are unremarkable.
IMPRESSION: 1. No acute bony abnormality.
2. Osteoarthritis of the distal radioulnar joint and second distal
phalanx.

## 2020-09-27 MED ORDER — IBUPROFEN 800 MG PO TABS: 800.0000 mg | ORAL_TABLET | Freq: Three times a day (TID) | ORAL | 0 refills | Status: DC

## 2020-09-27 NOTE — ED Notes (Signed)
Unable to obtain temp due to pt drinking something cold before triage. Will attempt to obtain temp shortly

## 2020-09-27 NOTE — ED Triage Notes (Signed)
Pt states he slammed his R arm in car door accidentally today. Pt able to move his hand freely, PMS intact.

## 2020-09-27 NOTE — Discharge Instructions (Addendum)
Get help right away if: You suddenly develop severe pain in your hand. You previously had sensation in your hand and you suddenly lose sensation. Your wrist or hand becomes bent (contracted)involuntarily. Your symptoms had improved and they suddenly get worse. Your hand or fingers are turning pink or blue.

## 2020-09-27 NOTE — ED Notes (Signed)
Pt ambulatory to waiting room. Pt verbalized understanding of discharge instructions.   
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
## 2020-10-01 ENCOUNTER — Encounter (INDEPENDENT_AMBULATORY_CARE_PROVIDER_SITE_OTHER): Payer: Self-pay | Admitting: Ophthalmology

## 2020-10-01 ENCOUNTER — Ambulatory Visit (INDEPENDENT_AMBULATORY_CARE_PROVIDER_SITE_OTHER): Payer: Medicaid Other | Admitting: Ophthalmology

## 2020-10-01 ENCOUNTER — Other Ambulatory Visit: Payer: Self-pay

## 2020-10-01 DIAGNOSIS — H2512 Age-related nuclear cataract, left eye: Secondary | ICD-10-CM | POA: Diagnosis not present

## 2020-10-01 DIAGNOSIS — H43823 Vitreomacular adhesion, bilateral: Secondary | ICD-10-CM | POA: Diagnosis not present

## 2020-10-01 DIAGNOSIS — H2511 Age-related nuclear cataract, right eye: Secondary | ICD-10-CM | POA: Diagnosis not present

## 2020-10-01 DIAGNOSIS — E119 Type 2 diabetes mellitus without complications: Secondary | ICD-10-CM | POA: Diagnosis not present

## 2020-10-01 NOTE — Progress Notes (Signed)
10/01/2020     CHIEF COMPLAINT Patient presents for  Chief Complaint  Patient presents with   Retina Follow Up      HISTORY OF PRESENT ILLNESS: Robert Hooper is a 55 y.o. male who presents to the clinic today for:   HPI     Retina Follow Up   Patient presents with  Diabetic Retinopathy.  In both eyes.  This started 2 years ago.  Duration of years.  Since onset it is gradually worsening.        Comments   2 yr fu ou oct Pt states "I have blurred vision sometimes, they have me on medicine that messes with my vision. It isn't that bad though." LBS: 96 this morning A1C: "it was running high like 7 something because I am on prednisone because of throat disease." Pt states 1 yr ago he was diagnosed with a throat disease called pemphigus and has been on medication to help with that for the past year.      Last edited by Nelva Nay, COA on 10/01/2020  8:53 AM.      Referring physician: [name], MD [address] [place],  [place] [address]  HISTORICAL INFORMATION:   Selected notes from the MEDICAL RECORD NUMBER       CURRENT MEDICATIONS: No current outpatient medications on file. (Ophthalmic Drugs)   No current facility-administered medications for this visit. (Ophthalmic Drugs)   Current Outpatient Medications (Other)  Medication Sig   ALPRAZolam (XANAX) 1 MG tablet Take 1 mg by mouth 4 (four) times daily.   amphetamine-dextroamphetamine (ADDERALL) 30 MG tablet Take 1 tablet by mouth 2 (two) times daily.   baclofen (LIORESAL) 10 MG tablet Take 0.5-1 tablets (5-10 mg total) by mouth 3 (three) times daily as needed for muscle spasms.   FARXIGA 10 MG TABS tablet Take 10 mg by mouth daily.   glimepiride (AMARYL) 2 MG tablet Take 2 mg by mouth daily.   ibuprofen (ADVIL) 800 MG tablet Take 800 mg by mouth 2 (two) times daily as needed.   ibuprofen (ADVIL) 800 MG tablet Take 1 tablet (800 mg total) by mouth 3 (three) times daily.   lisinopril  (PRINIVIL,ZESTRIL) 10 MG tablet Take 10 mg by mouth daily.     metFORMIN (GLUCOPHAGE) 1000 MG tablet Take 1,000 mg by mouth 2 (two) times daily.   Multiple Vitamin (MULTIVITAMIN WITH MINERALS) TABS Take 1 tablet by mouth daily. (Patient not taking: Reported on 01/23/2020)   mycophenolate (CELLCEPT) 500 MG tablet Take 1,000 mg by mouth 2 (two) times daily.   predniSONE (DELTASONE) 10 MG tablet Take 40 mg by mouth daily.   QUEtiapine (SEROQUEL) 400 MG tablet Take 400 mg by mouth at bedtime.   No current facility-administered medications for this visit. (Other)      REVIEW OF SYSTEMS:    ALLERGIES Allergies  Allergen Reactions   Lactose Intolerance (Gi) Nausea And Vomiting   Penicillins Other (See Comments)    Childhood reaction    PAST MEDICAL HISTORY Past Medical History:  Diagnosis Date   Arthritis    Back pain    chronic   Bipolar 1 disorder (HCC)    Depression    Diabetes mellitus without complication (HCC)    Hemorrhoids    Hypertension    History reviewed. No pertinent surgical history.  FAMILY HISTORY History reviewed. No pertinent family history.  SOCIAL HISTORY Social History   Tobacco Use   Smoking status: Never   Smokeless tobacco: Never  Substance Use Topics   Alcohol use: No   Drug use: No         OPHTHALMIC EXAM:  Base Eye Exam     Visual Acuity (ETDRS)       Right Left   Dist [place] 20/20 -1 20/25 -1         Tonometry (Tonopen, 8:53 AM)       Right Left   Pressure 18 20         Pupils       Pupils Dark Light APD   Right PERRL 6 5 None   Left PERRL 6 5 None         Visual Fields (Counting fingers)       Left Right    Full Full         Extraocular Movement       Right Left    Full Full         Neuro/Psych     Oriented x3: Yes   Mood/Affect: Normal         Dilation     Both eyes: 1.0% Mydriacyl, 2.5% Phenylephrine @ 8:57 AM           Slit Lamp and Fundus Exam     External Exam       Right  Left   External Normal Normal         Slit Lamp Exam       Right Left   Lids/Lashes Normal Normal   Conjunctiva/Sclera White and quiet White and quiet   Cornea Clear Clear   Anterior Chamber Deep and quiet Deep and quiet   Iris Round and reactive Round and reactive   Lens 1+ Nuclear sclerosis 1+ Nuclear sclerosis   Anterior Vitreous Normal Normal         Fundus Exam       Right Left   Posterior Vitreous Normal Normal   Disc Normal Normal   C/D Ratio 0.6 0.6   Macula Normal Normal   Vessels no DR no DR   Periphery Normal Normal            IMAGING AND PROCEDURES  Imaging and Procedures for 10/01/20  OCT, Retina - OU - Both Eyes       Right Eye Quality was good. Scan locations included subfoveal. Central Foveal Thickness: 233. Progression has been stable. Findings include normal foveal contour, vitreomacular adhesion .   Left Eye Quality was good. Scan locations included subfoveal. Central Foveal Thickness: 234. Progression has been stable. Findings include normal foveal contour, vitreomacular adhesion .   Notes No active maculopathy with VMA, OU             ASSESSMENT/PLAN:  Nuclear sclerotic cataract of left eye The nature of cataract was discussed with the patient as well as the elective nature of surgery. The patient was reassured that surgery at a later date does not put the patient at risk for a worse outcome. It was emphasized that the need for surgery is dictated by the patient's quality of life as influenced by the cataract. Patient was instructed to maintain close follow up with their general eye care doctor.  Early color changes similar to built and sunglasses analogy reviewed with the patient.  Early symptoms may in the future be trouble seeing at night particularly oncoming headlights on 2 [facility] or needing stronger like to read  Vitreomacular adhesion of both eyes Physiologic OU no intervention required  Diabetes mellitus without  complication [facility]) The patient has diabetes without any evidence of retinopathy. The patient advised to maintain good blood glucose control, excellent blood pressure control, and favorable levels of cholesterol, low density lipoprotein, and high density lipoproteins. Follow up in 1 year was recommended. Explained that fluctuations in visual acuity , or "out of focus", may result from large variations of blood sugar control.      ICD-10-CM   1. Vitreomacular adhesion of both eyes  H43.823 OCT, Retina - OU - Both Eyes    2. Diabetes mellitus without complication (HCC)  E11.9     3. Nuclear sclerotic cataract of left eye  H25.12     4. Age-related nuclear cataract of right eye  H25.11       1.  Examination OU today to follow-up for diabetes mellitus with no history of detectable retinopathy in the past, 2 years previous last  2.  No detectable diabetic retinopathy today, follow-up in 2 years  3.  Mild cataract OU  Ophthalmic Meds Ordered this visit:  No orders of the defined types were placed in this encounter.      Return in about 2 years (around 10/02/2022) for DILATE OU, COLOR FP, OCT.  There are no Patient Instructions on file for this visit.   Explained the diagnoses, plan, and follow up with the patient and they expressed understanding.  Patient expressed understanding of the importance of proper follow up care.   Alford Highland Ercell Perlman M.D. Diseases & Surgery of the Retina and Vitreous [facility] 10/01/20     Abbreviations: M myopia (nearsighted); A astigmatism; H hyperopia (farsighted); P presbyopia; Mrx spectacle prescription;  CTL contact lenses; OD right eye; OS left eye; OU both eyes  XT exotropia; ET esotropia; PEK punctate epithelial keratitis; PEE punctate epithelial erosions; DES dry eye syndrome; MGD meibomian gland dysfunction; ATs artificial tears; PFAT's preservative free artificial tears; NSC nuclear sclerotic cataract; PSC posterior subcapsular  cataract; ERM epi-retinal membrane; PVD posterior vitreous detachment; RD retinal detachment; DM diabetes mellitus; DR diabetic retinopathy; NPDR non-proliferative diabetic retinopathy; PDR proliferative diabetic retinopathy; CSME clinically significant macular edema; DME diabetic macular edema; dbh dot blot hemorrhages; CWS cotton wool spot; POAG primary open angle glaucoma; C/D cup-to-disc ratio; HVF humphrey visual field; GVF goldmann visual field; OCT optical coherence tomography; IOP intraocular pressure; BRVO Branch retinal vein occlusion; CRVO central retinal vein occlusion; CRAO central retinal artery occlusion; BRAO branch retinal artery occlusion; RT retinal tear; SB scleral buckle; PPV pars plana vitrectomy; VH Vitreous hemorrhage; PRP panretinal laser photocoagulation; IVK intravitreal kenalog; VMT vitreomacular traction; MH Macular hole;  NVD neovascularization of the disc; NVE neovascularization elsewhere; AREDS age related eye disease study; ARMD age related macular degeneration; POAG primary open angle glaucoma; EBMD epithelial/anterior basement membrane dystrophy; ACIOL anterior chamber intraocular lens; IOL intraocular lens; PCIOL posterior chamber intraocular lens; Phaco/IOL phacoemulsification with intraocular lens placement; PRK photorefractive keratectomy; LASIK laser assisted in situ keratomileusis; HTN hypertension; DM diabetes mellitus; COPD chronic obstructive pulmonary disease

## 2020-10-01 NOTE — Assessment & Plan Note (Signed)
The nature of cataract was discussed with the patient as well as the elective nature of surgery. The patient was reassured that surgery at a later date does not put the patient at risk for a worse outcome. It was emphasized that the need for surgery is dictated by the patient's quality of life as influenced by the cataract. Patient was instructed to maintain close follow up with their general eye care doctor.  Early color changes similar to built and sunglasses analogy reviewed with the patient.  Early symptoms may in the future be trouble seeing at night particularly oncoming headlights on 2 [address] or needing stronger like to read

## 2020-10-01 NOTE — Assessment & Plan Note (Signed)
Physiologic OU no intervention required

## 2020-10-01 NOTE — Assessment & Plan Note (Signed)

## 2020-10-05 ENCOUNTER — Other Ambulatory Visit: Payer: Self-pay

## 2020-10-05 ENCOUNTER — Emergency Department (HOSPITAL_COMMUNITY)
Admission: EM | Admit: 2020-10-05 | Discharge: 2020-10-05 | Disposition: A | Discharge status: Home / Self Care | Payer: Medicaid Other | Attending: Emergency Medicine | Admitting: Emergency Medicine

## 2020-10-05 ENCOUNTER — Emergency Department (HOSPITAL_COMMUNITY): Payer: Medicaid Other

## 2020-10-05 ENCOUNTER — Encounter (HOSPITAL_COMMUNITY): Payer: Self-pay | Admitting: Emergency Medicine

## 2020-10-05 DIAGNOSIS — M79631 Pain in right forearm: Secondary | ICD-10-CM | POA: Diagnosis not present

## 2020-10-05 DIAGNOSIS — Z7984 Long term (current) use of oral hypoglycemic drugs: Secondary | ICD-10-CM | POA: Insufficient documentation

## 2020-10-05 DIAGNOSIS — I10 Essential (primary) hypertension: Secondary | ICD-10-CM | POA: Insufficient documentation

## 2020-10-05 DIAGNOSIS — E119 Type 2 diabetes mellitus without complications: Secondary | ICD-10-CM | POA: Diagnosis not present

## 2020-10-05 DIAGNOSIS — R110 Nausea: Secondary | ICD-10-CM | POA: Insufficient documentation

## 2020-10-05 DIAGNOSIS — Z79899 Other long term (current) drug therapy: Secondary | ICD-10-CM | POA: Insufficient documentation

## 2020-10-05 DIAGNOSIS — M79601 Pain in right arm: Secondary | ICD-10-CM

## 2020-10-05 LAB — URINALYSIS, ROUTINE W REFLEX MICROSCOPIC
Bacteria, UA: NONE SEEN
Bilirubin Urine: NEGATIVE
Glucose, UA: >=500 mg/dL — AB
Hgb urine dipstick: NEGATIVE
Ketones, ur: NEGATIVE mg/dL
Leukocytes,Ua: NEGATIVE
Nitrite: NEGATIVE
Protein, ur: NEGATIVE mg/dL
Specific Gravity, Urine: 1.031 — ABNORMAL HIGH (ref 1.005–1.030)
pH: 5 (ref 5.0–8.0)

## 2020-10-05 LAB — BASIC METABOLIC PANEL
Anion gap: 11 (ref 5–15)
BUN: 12 mg/dL (ref 6–20)
CO2: 20 mmol/L — ABNORMAL LOW (ref 22–32)
Calcium: 9.4 mg/dL (ref 8.9–10.3)
Chloride: 105 mmol/L (ref 98–111)
Creatinine, Ser: 1.09 mg/dL (ref 0.61–1.24)
GFR, Estimated: >60 mL/min (ref >60)
Glucose, Bld: 235 mg/dL — ABNORMAL HIGH (ref 70–99)
Potassium: 3.8 mmol/L (ref 3.5–5.1)
Sodium: 136 mmol/L (ref 135–145)

## 2020-10-05 LAB — CBC
HCT: 48.1 % (ref 39.0–52.0)
Hemoglobin: 15.1 g/dL (ref 13.0–17.0)
MCH: 27.9 pg (ref 26.0–34.0)
MCHC: 31.4 g/dL (ref 30.0–36.0)
MCV: 88.9 fL (ref 80.0–100.0)
Platelets: 225 10*3/uL (ref 150–400)
RBC: 5.41 MIL/uL (ref 4.22–5.81)
RDW: 13.9 % (ref 11.5–15.5)
WBC: 6.4 10*3/uL (ref 4.0–10.5)
nRBC: 0 % (ref 0.0–0.2)

## 2020-10-05 IMAGING — CR DG FOREARM 2V*R*
2 series · 2 of 2 positions shown · non-contrast
Comparison: Left wrist radiographs [DATE] and [DATE]

CLINICAL DATA: Persistent ulnar-sided wrist pain.

EXAM:
RIGHT FOREARM - 2 VIEW

[forearm ap]
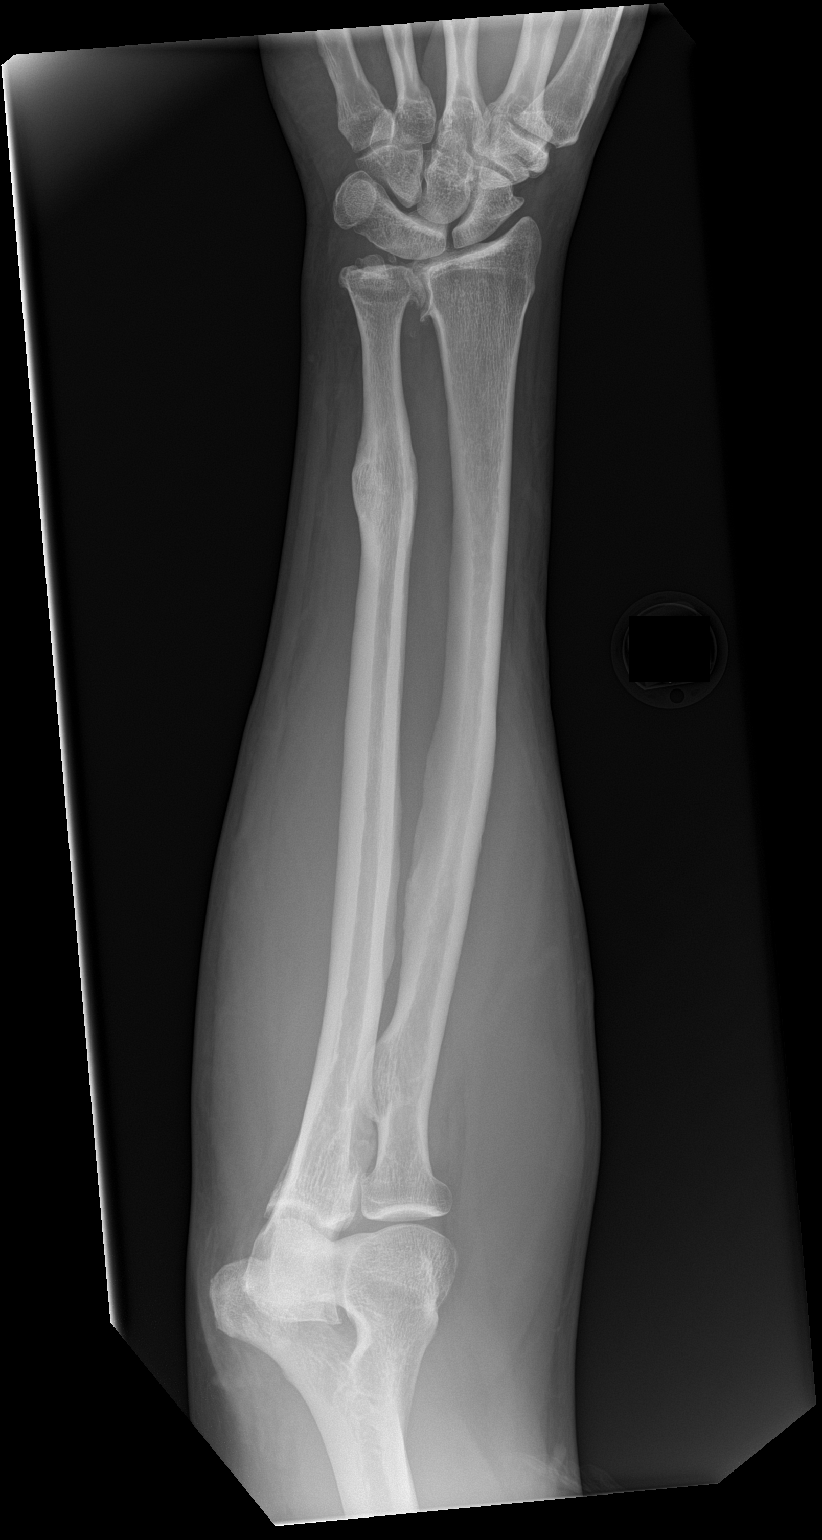

[forearm lat]
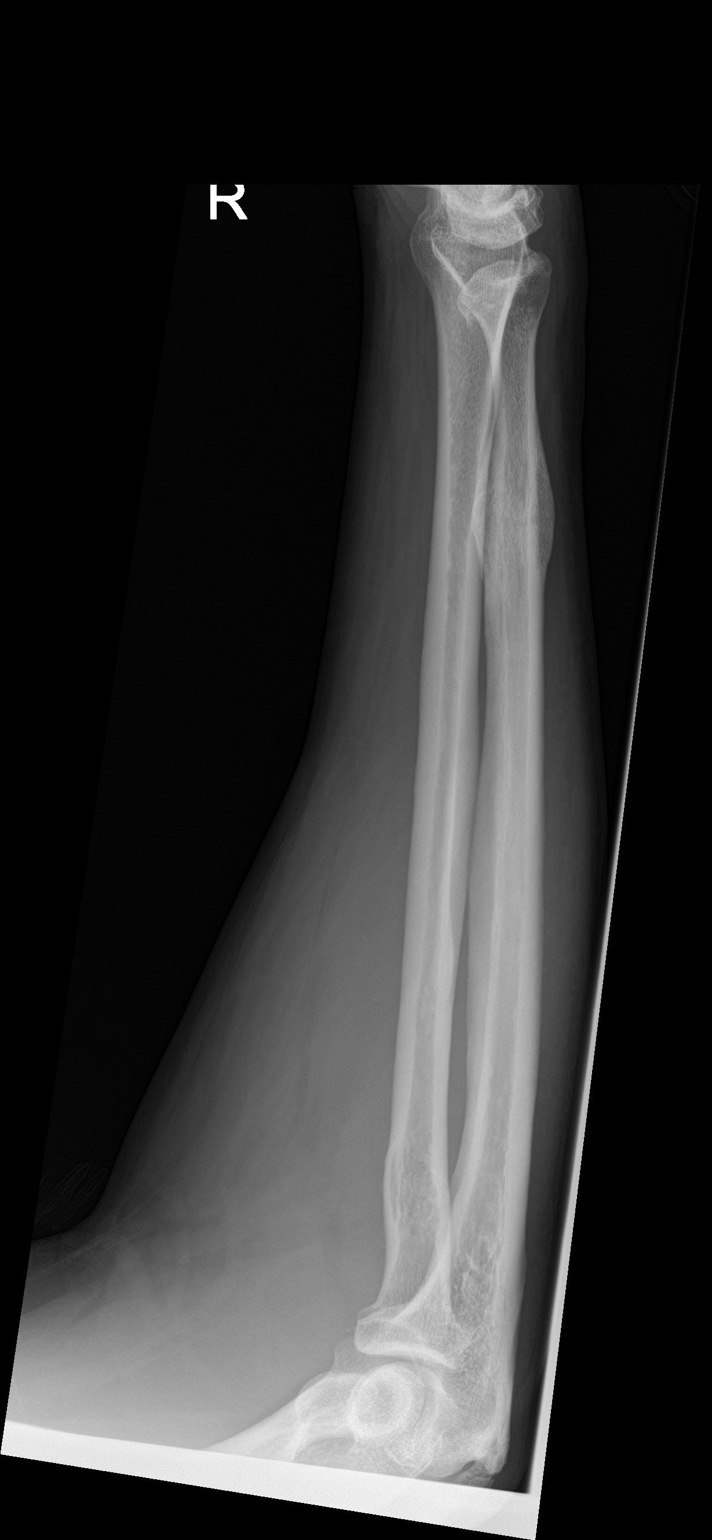

[2 of 2 positions shown; findings below may reference images not displayed]

FINDINGS: Degenerative changes again noted at the distal radioulnar joint.
Remote ulnar styloid fracture noted. No acute fracture. Carpal
coalition again noted. Remote ulnar metaphysis fracture also noted.
IMPRESSION: 1. No acute abnormality.
2. Stable degenerative changes.

## 2020-10-05 MED ORDER — ONDANSETRON 4 MG PO TBDP
4.0000 mg | ORAL_TABLET | Freq: Once | ORAL | Status: AC
  Administered 2020-10-05: 4 mg via ORAL
  Filled 2020-10-05: qty 1

## 2020-10-05 MED ORDER — HYDROCODONE-ACETAMINOPHEN 5-325 MG PO TABS
1.0000 | ORAL_TABLET | Freq: Once | ORAL | Status: AC
Start: 2020-10-05 — End: 2020-10-05
  Administered 2020-10-05: 1 via ORAL
  Filled 2020-10-05: qty 1

## 2020-10-05 MED ORDER — IBUPROFEN 400 MG PO TABS
400.0000 mg | ORAL_TABLET | Freq: Once | ORAL | Status: AC
  Administered 2020-10-05: 400 mg via ORAL
  Filled 2020-10-05: qty 1
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
## 2020-10-05 NOTE — ED Notes (Signed)
This triage RN attempted to call pt back for a room. Pt here for hand pain. Pt requested a wheelchair to triage room. This RN retrieved wheelchair on pt request. While assisting pt in wheelchair, this RN asked "do you walk at home?" to assess if there was any acute alteration in ambulation or other undisclosed problem. Pt did not respond to this RN, RN repeated question presuming pt did not hear. Pt turned to this RN and aggressively stated "What the fuck kind of question is that?". This RN explained that it was to assess for any other acute issue and pt stated "What is this some kind of fucking problem for you?" Pt became further agitated in triage room and refused to allow this RN to assess vital signs. PA Laveda Norman to chairside, and alternate triage RN completed triage. Education provided to pt that cursing or verbal aggression towards staff members would not be tolerated.
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
## 2020-10-05 NOTE — ED Provider Notes (Signed)
[facility] EMERGENCY DEPARTMENT Provider Note   CSN: [id number] Arrival date & time: 10/05/20  1603     History Chief Complaint  Patient presents with   Arm Injury    Robert Hooper is a 55 y.o. male who presents the emergency department today for further evaluation of right forearm pain that began a week ago.  Patient states that he slammed his right arm into his car door by accident.  He was seen and evaluated in the emergency department shortly after and had a hand and wrist film that was negative for fracture.  Pain has been persistent and wants to be reevaluated.  He complains of pain localized to the right forearm characterized as a throbbing sensation.  Rated moderate in severity.  Reports associated nausea but denies vomiting, weakness/numbness to the right fingers. He is right hand dominant.  The history is provided by the patient. No language interpreter was used.  Arm Injury     Past Medical History:  Diagnosis Date   Arthritis    Back pain    chronic   Bipolar 1 disorder (HCC)    Depression    Diabetes mellitus without complication (HCC)    Hemorrhoids    Hypertension     Patient Active Problem List   Diagnosis Date Noted   Diabetes mellitus without complication (HCC) 10/01/2020   Nuclear sclerotic cataract of left eye 10/01/2020   Age-related nuclear cataract of right eye 10/01/2020   Vitreomacular adhesion of both eyes 10/01/2020   Pemphigus vulgaris 06/23/2019   Mucosal irritation of oral cavity 04/13/2019   Epistaxis, recurrent 02/13/2019   Pharyngitis 02/13/2019   Internal hemorrhoid, bleeding 09/20/2012    History reviewed. No pertinent surgical history.     No family history on file.  Social History   Tobacco Use   Smoking status: Never   Smokeless tobacco: Never  Substance Use Topics   Alcohol use: No   Drug use: No    Home Medications Prior to Admission medications   Medication Sig Start Date End Date Taking?  Authorizing Provider  ALPRAZolam Prudy Feeler) 1 MG tablet Take 1 mg by mouth 4 (four) times daily. 03/14/20   [provider]  amphetamine-dextroamphetamine (ADDERALL) 30 MG tablet Take 1 tablet by mouth 2 (two) times daily. 02/28/20   [provider]  baclofen (LIORESAL) 10 MG tablet Take 0.5-1 tablets (5-10 mg total) by mouth 3 (three) times daily as needed for muscle spasms. 01/23/20   Hilts, Casimiro Needle, MD  FARXIGA 10 MG TABS tablet Take 10 mg by mouth daily. 10/30/19   [provider]  glimepiride (AMARYL) 2 MG tablet Take 2 mg by mouth daily. 12/15/19   [provider]  ibuprofen (ADVIL) 800 MG tablet Take 800 mg by mouth 2 (two) times daily as needed. 12/18/19   [provider]  ibuprofen (ADVIL) 800 MG tablet Take 1 tablet (800 mg total) by mouth 3 (three) times daily. 09/27/20   Arthor Captain, PA-C  lisinopril (PRINIVIL,ZESTRIL) 10 MG tablet Take 10 mg by mouth daily.      [provider]  metFORMIN (GLUCOPHAGE) 1000 MG tablet Take 1,000 mg by mouth 2 (two) times daily. 12/12/19   [provider]  Multiple Vitamin (MULTIVITAMIN WITH MINERALS) TABS Take 1 tablet by mouth daily. Patient not taking: Reported on 01/23/2020    [provider]  mycophenolate (CELLCEPT) 500 MG tablet Take 1,000 mg by mouth 2 (two) times daily. 01/03/20   [provider]  predniSONE (  DELTASONE) 10 MG tablet Take 40 mg by mouth daily. 11/30/19   [provider]  QUEtiapine (SEROQUEL) 400 MG tablet Take 400 mg by mouth at bedtime. 02/23/20   [provider]    Allergies    Lactose intolerance (gi) and Penicillins  Review of Systems   Review of Systems  All other systems reviewed and are negative.  Physical Exam Updated Vital Signs BP 117/83   Pulse 91   Temp 98.2 F (36.8 C) (Oral)   Resp 17   SpO2 99%   Physical Exam Vitals reviewed.  Constitutional:      Appearance: Normal appearance.  HENT:     Head:  Normocephalic and atraumatic.  Eyes:     General:        Right eye: No discharge.        Left eye: No discharge.     Conjunctiva/sclera: Conjunctivae normal.  Pulmonary:     Effort: Pulmonary effort is normal.  Musculoskeletal:     Comments: There is tenderness and minimal swelling to the right medial forearm.  Compartments are soft and he is neurovascularly intact distal to the injury.  Skin:    General: Skin is warm and dry.     Findings: No rash.  Neurological:     General: No focal deficit present.     Mental Status: He is alert.  Psychiatric:        Mood and Affect: Mood normal.        Behavior: Behavior normal.    ED Results / Procedures / Treatments   Labs (all labs ordered are listed, but only abnormal results are displayed) Labs Reviewed  BASIC METABOLIC PANEL - Abnormal; Notable for the following components:      Result Value   CO2 20 (*)    Glucose, Bld 235 (*)    All other components within normal limits  URINALYSIS, ROUTINE W REFLEX MICROSCOPIC - Abnormal; Notable for the following components:   Specific Gravity, Urine 1.031 (*)    Glucose, UA >=500 (*)    All other components within normal limits  CBC    EKG None  Radiology DG Forearm Right  Result Date: 10/05/2020 CLINICAL DATA:  Persistent ulnar-sided wrist pain. EXAM: RIGHT FOREARM - 2 VIEW COMPARISON:  Left wrist radiographs 09/27/2020 and 01/16/2014 FINDINGS: Degenerative changes again noted at the distal radioulnar joint. Remote ulnar styloid fracture noted. No acute fracture. Carpal coalition again noted. Remote ulnar metaphysis fracture also noted. IMPRESSION: 1. No acute abnormality. 2. Stable degenerative changes. Electronically Signed   By: Marin Okerlund M.D.   On: 10/05/2020 20:02    Procedures Procedures   Medications Ordered in ED Medications  ondansetron (ZOFRAN-ODT) disintegrating tablet 4 mg (4 mg Oral Given 10/05/20 1643)  ibuprofen (ADVIL) tablet 400 mg (400 mg Oral Given 10/05/20  2043)  HYDROcodone-acetaminophen (NORCO/VICODIN) 5-325 MG per tablet 1 tablet (1 tablet Oral Given 10/05/20 2043)  ondansetron (ZOFRAN-ODT) disintegrating tablet 4 mg (4 mg Oral Given 10/05/20 2043)    ED Course  I have reviewed the triage vital signs and the nursing notes.  Pertinent labs & imaging results that were available during my care of the patient were reviewed by me and considered in my medical decision making (see chart for details).    MDM Rules/Calculators/A&P                         Khai Arrona is a 55 year old male who presents to the emergency  department today for further evaluation of right forearm pain.  Clinically he appears well.  Compartments are soft and he is neurovascularly intact distal to the injury.  Imaging reveals old healed ulnar metaphysis fracture. This appears unchanged from previous imaging done in 2015.  This is where he is point tender.  He likely aggravated the area when he slammed the arm in the car door.  A follow-up appointment with orthopedics in 1 week.  I will have him keep that appointment.  Tylenol/ibuprofen for pain and swelling.  Strict return precautions given.  He is safer discharge.  Final Clinical Impression(s) / ED Diagnoses Final diagnoses:  Right arm pain    Rx / DC Orders ED Discharge Orders     None        Jolyn Lent 10/05/20 2108    Ernie Avena, MD 10/06/20 1251

## 2020-10-05 NOTE — Discharge Instructions (Addendum)
You were seen and evaluated in the emergency department today for further evaluation of right arm pain.  As we discussed, there does not appear to be any fracture on imaging.  You likely have bruised your arm. Please keep your appointment with orthopedics next week.  Tylenol/ibuprofen as needed for pain and swelling.  Please return to the emergency department if you experience worsening pain, weakness/numbness in your right hand, significant swelling, or any other concerns you might have.

## 2020-10-10 ENCOUNTER — Ambulatory Visit (INDEPENDENT_AMBULATORY_CARE_PROVIDER_SITE_OTHER): Payer: Medicaid Other | Admitting: Surgical

## 2020-10-10 ENCOUNTER — Other Ambulatory Visit: Payer: Self-pay

## 2020-10-10 ENCOUNTER — Encounter: Payer: Self-pay | Admitting: Surgical

## 2020-10-10 DIAGNOSIS — S46012A Strain of muscle(s) and tendon(s) of the rotator cuff of left shoulder, initial encounter: Secondary | ICD-10-CM | POA: Diagnosis not present

## 2020-10-10 DIAGNOSIS — M79602 Pain in left arm: Secondary | ICD-10-CM | POA: Diagnosis not present

## 2020-10-10 DIAGNOSIS — Z8781 Personal history of (healed) traumatic fracture: Secondary | ICD-10-CM | POA: Diagnosis not present

## 2020-10-12 MED ORDER — BUPIVACAINE HCL 0.5 % IJ SOLN
9.0000 mL | INTRAMUSCULAR | Status: AC | PRN
  Administered 2020-10-10: 9 mL via INTRA_ARTICULAR

## 2020-10-12 MED ORDER — LIDOCAINE HCL 1 % IJ SOLN
5.0000 mL | INTRAMUSCULAR | Status: AC | PRN
  Administered 2020-10-10: 5 mL

## 2020-10-12 NOTE — Progress Notes (Signed)
Office Visit Note   Patient: Robert Hooper           Date of Birth: 01/25/1966           MRN: [id number] Visit Date: 10/10/2020 Requested by: [name], MD [address] [place],  [place] [address] PCP: [name], MD  Subjective: Chief Complaint  Patient presents with   Right Wrist - Follow-up    HPI: Robert Hooper is a 55 y.o. male who presents to the office complaining of right forearm pain.  Patient states that he closed his arm in a car door in late August.  However since then he has had aching and sharp pain that he mostly localizes to the distal ulnar shaft.  He has been using a Velcro wrist brace which has provided some relief.  He is taking ibuprofen for pain.  He does have history of prior right forearm fracture more than 5 years ago.  No other injuries noted.  He denies any radicular pain or numbness/tingling.  He did have swelling immediately after the injury and some bruising was noted couple days afterward.  He has been to the emergency department twice with radiographs that were negative for any acute fracture..                ROS: All systems reviewed are negative as they relate to the chief complaint within the history of present illness.  Patient denies fevers or chills.  Assessment & Plan: Visit Diagnoses:  1. Left arm pain     Plan: Patient is a 55 year old male who presents complaining of right distal forearm pain ever since slamming his arm in a car door.  Mostly has pain localized to a area of point tenderness over the distal ulna which seems to correspond with prior fracture site from old ulnar shaft injury.  Radiographs of the right forearm from 10/05/2020 were reviewed and show a slight lucency which may reflect a nondisplaced fracture line through the old fracture site of the distal ulna.  This lucency is not present on radiographs from immediately after the injury or from prior radiographs in 2015.  Plan to obtain CT scan of the right forearm for  further evaluation of occult fracture.  Additionally, patient has a history of left shoulder rotator cuff arthropathy.  He has had good relief from Toradol injections in the past.  Currently his left shoulder pain has returned and he is requesting repeat injection.  Toradol injection administered and patient tolerated procedure well.  Follow-up with the office after CT scan.  Follow-Up Instructions: No follow-ups on file.   Orders:  Orders Placed This Encounter  Procedures   CT FOREARM LEFT WO CONTRAST   No orders of the defined types were placed in this encounter.     Procedures: Large Joint Inj: L subacromial bursa on 10/10/2020 11:43 AM Indications: diagnostic evaluation and pain Details: 18 G 1.5 in needle, posterior approach  Arthrogram: No  Medications: 9 mL bupivacaine 0.5 %; 5 mL lidocaine 1 % (Bupivacaine mixed with Toradol) Outcome: tolerated well, no immediate complications Procedure, treatment alternatives, risks and benefits explained, specific risks discussed. Consent was given by the patient. Immediately prior to procedure a time out was called to verify the correct patient, procedure, equipment, support staff and site/side marked as required. Patient was prepped and draped in the usual sterile fashion.      Clinical Data: No additional findings.  Objective: Vital Signs: There were no vitals taken for this visit.  Physical Exam:  Constitutional: Patient appears well-developed HEENT:  Head: Normocephalic Eyes:EOM are normal Neck: Normal range of motion Cardiovascular: Normal rate Pulmonary/chest: Effort normal Neurologic: Patient is alert Skin: Skin is warm Psychiatric: Patient has normal mood and affect  Ortho Exam: Ortho exam demonstrates right wrist without tenderness in the anatomic snuffbox, 3-4 portal, 4-5 portal, ulnar fovea.  He does have tenderness along the distal ulnar shaft with an area of localized swelling.  No ecchymosis noted.  Increased pain  with passive ulnar deviation and wrist extension but no significant pain with flexion and radial deviation of the wrist.  He has no difficulty with EPL, wrist extension, finger abduction, finger adduction actively.  No elbow swelling or tenderness throughout the elbow noted.  Specialty Comments:  No specialty comments available.  Imaging: No results found.   PMFS History: Patient Active Problem List   Diagnosis Date Noted   Diabetes mellitus without complication (HCC) 10/01/2020   Nuclear sclerotic cataract of left eye 10/01/2020   Age-related nuclear cataract of right eye 10/01/2020   Vitreomacular adhesion of both eyes 10/01/2020   Pemphigus vulgaris 06/23/2019   Mucosal irritation of oral cavity 04/13/2019   Epistaxis, recurrent 02/13/2019   Pharyngitis 02/13/2019   Internal hemorrhoid, bleeding 09/20/2012   Past Medical History:  Diagnosis Date   Arthritis    Back pain    chronic   Bipolar 1 disorder (HCC)    Depression    Diabetes mellitus without complication (HCC)    Hemorrhoids    Hypertension     History reviewed. No pertinent family history.  History reviewed. No pertinent surgical history. Social History   Occupational History   Not on file  Tobacco Use   Smoking status: Never   Smokeless tobacco: Never  Substance and Sexual Activity   Alcohol use: No   Drug use: No   Sexual activity: Yes

## 2020-10-14 ENCOUNTER — Other Ambulatory Visit: Payer: Self-pay | Admitting: Surgical

## 2020-10-14 DIAGNOSIS — M79601 Pain in right arm: Secondary | ICD-10-CM

## 2020-10-15 ENCOUNTER — Other Ambulatory Visit: Payer: Self-pay

## 2020-10-15 ENCOUNTER — Ambulatory Visit
Admission: RE | Admit: 2020-10-15 | Discharge: 2020-10-15 | Disposition: A | Discharge status: Home / Self Care | Payer: Medicaid Other | Source: Ambulatory Visit | Attending: Surgical | Admitting: Surgical

## 2020-10-15 DIAGNOSIS — M79601 Pain in right arm: Secondary | ICD-10-CM

## 2020-10-15 IMAGING — CT CT FOREARM*R* W/O CM
3 series · 8 of 14 positions shown, 9 images · non-contrast
Comparison: Forearm radiograph [DATE], right hand radiograph
[DATE]

CLINICAL DATA: Forearm pain, stress fracture suspected, neg xray
rule out ulnar fracture

EXAM:
CT OF THE RIGHT FOREARM WITHOUT CONTRAST
TECHNIQUE: Multidetector CT imaging was performed according to the standard
protocol. Multiplanar CT image reconstructions were also generated.

[Series 3: ext-soft · axial · 0.34mm/px · z∈[+110,+212]mm · 2 of 153 slices shown]
[im 51/153  soft-tissue]
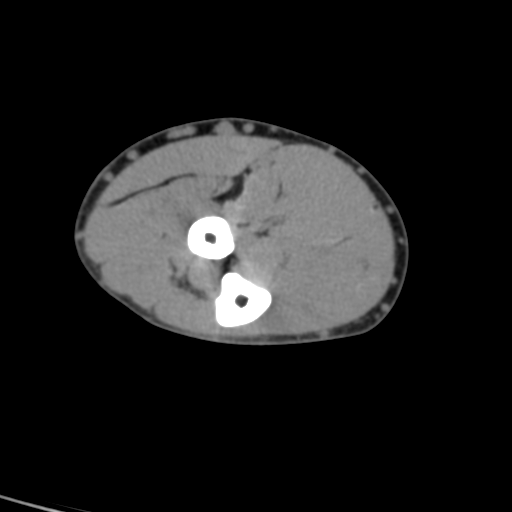
[im 102/153  soft-tissue]
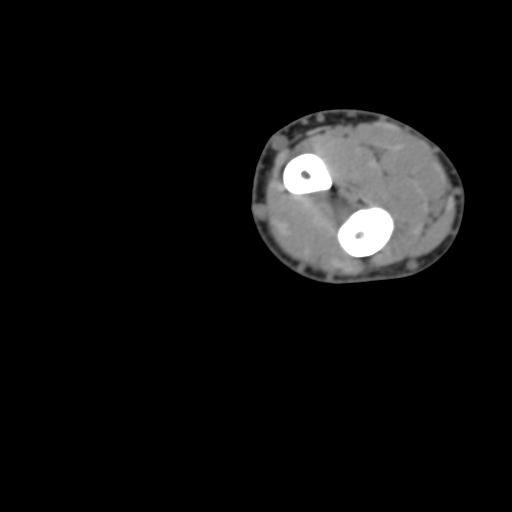

[Series 11: ax bone · axial · 0.31mm/px · z∈[+119,+282]mm · 3 of 177 slices shown, 4 images]
[im 45/177  soft-tissue]
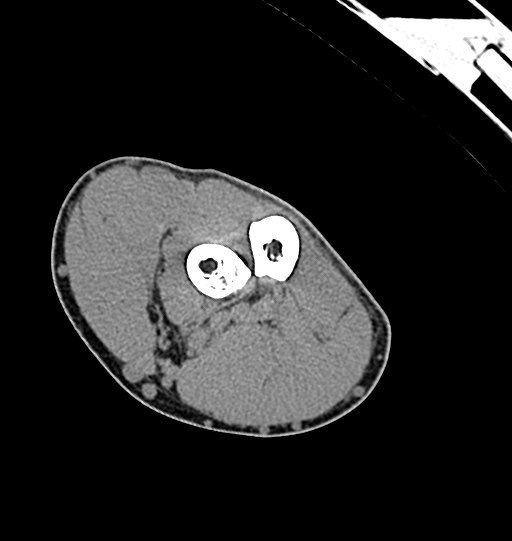
[im 45/177  bone]
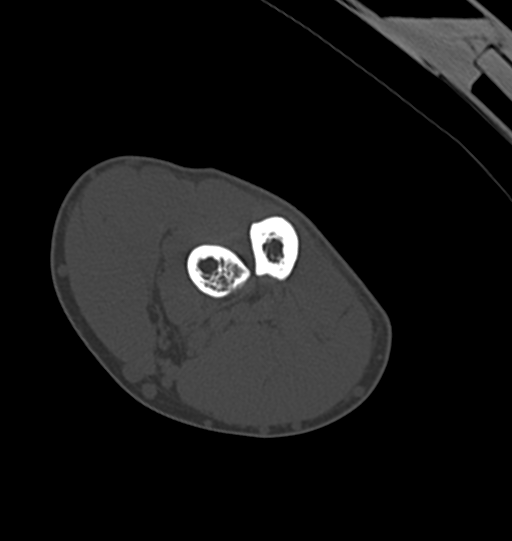
[im 89/177  bone]
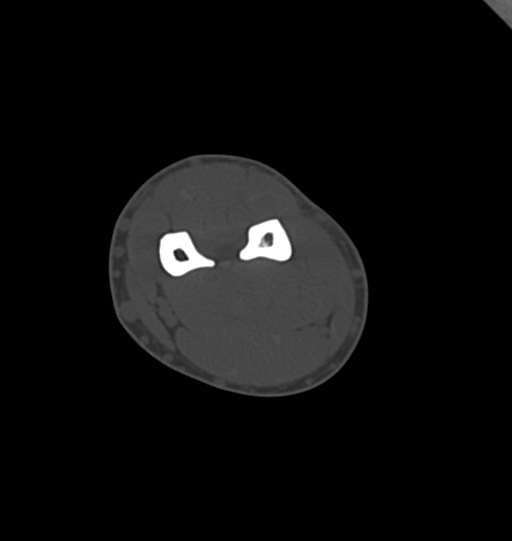
[im 133/177  bone]
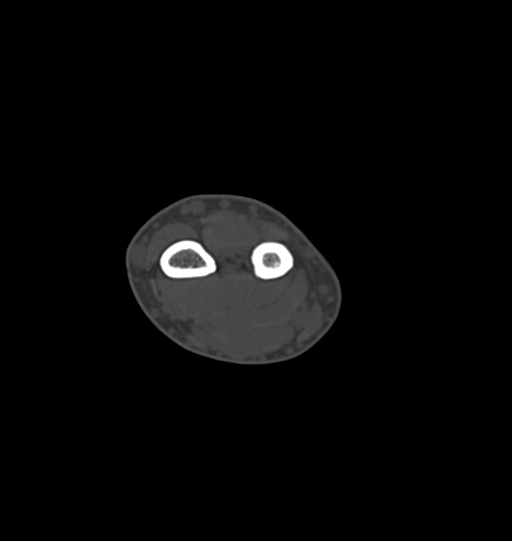

[Series 12: axial soft · axial · 0.31mm/px · z∈[+119,+282]mm · 3 of 178 slices shown]
[im 45/178  soft-tissue]
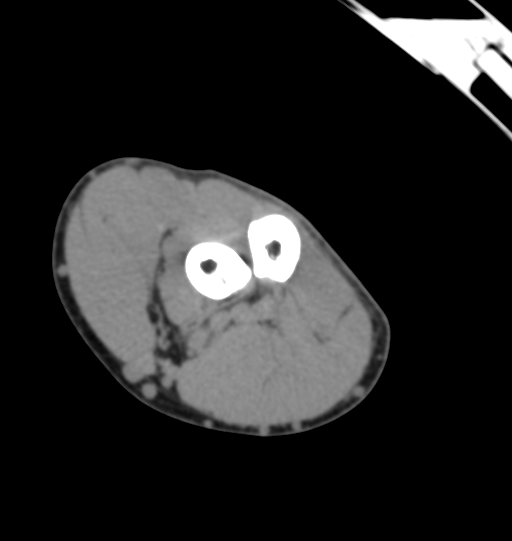
[im 89/178  soft-tissue]
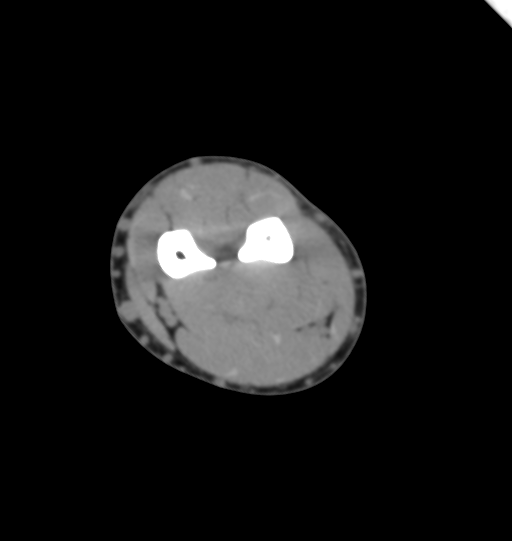
[im 133/178  soft-tissue]
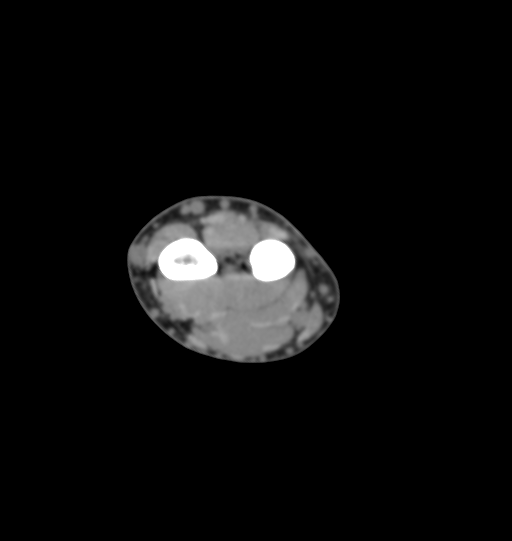

[8 of 14 positions shown; findings below may reference images not displayed]

FINDINGS: Bones/Joint/Cartilage

There is no evidence of acute fracture. There is an old healed
distal ulnar diaphyseal fracture. There are mild radiocapitellar and
ulnotrochlear joint degenerative changes and enthesophyte formation
along the distal triceps tendon. There is no visible elbow joint
effusion. There are degenerative changes of the distal radioulnar
joint and an old ulnar styloid injury. Lunotriquetral coalition, as
seen on prior exams, normal variant.

Ligaments

Suboptimally assessed by CT.

Muscles and Tendons

There is no muscle atrophy.  No intramuscular collection.

Soft tissues

Mild posterior soft tissue swelling along the olecranon. No focal
fluid collection.
IMPRESSION: No evidence of acute fracture. Old healed distal ulnar diaphyseal
fracture and ulnar styloid injury.

Degenerative changes of the elbow and wrist.

## 2020-10-25 ENCOUNTER — Ambulatory Visit (INDEPENDENT_AMBULATORY_CARE_PROVIDER_SITE_OTHER): Payer: Medicaid Other | Admitting: Surgical

## 2020-10-25 ENCOUNTER — Other Ambulatory Visit: Payer: Self-pay

## 2020-10-25 ENCOUNTER — Encounter: Payer: Self-pay | Admitting: Surgical

## 2020-10-25 DIAGNOSIS — M25539 Pain in unspecified wrist: Secondary | ICD-10-CM | POA: Diagnosis not present

## 2020-10-25 NOTE — Progress Notes (Signed)
Office Visit Note   Patient: Robert Hooper           Date of Birth: August 31, 1965           MRN: [id number] Visit Date: 10/25/2020              Requested by: [name], MD [address] [place],  [place] [address] PCP: [name], MD   Assessment & Plan: Visit Diagnoses:  1. Pain of ulnar side of wrist     Plan: Discussed with patient that his ulnar sided wrist pain seems to be related to the ECU tendon/subsheath more than the TFCC.  He has no real pain at the fovea but seems to have pain w/ palpation of the ECU tendon.  He has tried bracing and NSAIDs.  We will get an MRI of his R wrist to further evaluate.  I'll see him back in the office once the study is done.   Follow-Up Instructions: No follow-ups on file.   Orders:  Orders Placed This Encounter  Procedures   MR Wrist Right w/o contrast   No orders of the defined types were placed in this encounter.     Procedures: No procedures performed   Clinical Data: No additional findings.   Subjective: Chief Complaint  Patient presents with   Left Forearm - Follow-up    CT scan review     This is a 55 yo RHD M who presents with right ulnar sided wrist pain.  He slammed his arm in the car door approximately 1 month ago.  He's described pain at the ulnar wrist along the ECU tendon since the injury.  A CT was obtained which demonstrated an old, healed fracture of the distal ulna.  The pain wakes him from sleep and is worse w/ ROM.  He has tried a brace and PO NSAIDs.  He denies numbness or paresthesias.  He denies pain elsewhere in the wrist.    Review of Systems  Constitutional: Negative.   Respiratory: Negative.    Cardiovascular: Negative.   Skin: Negative.   Neurological: Negative.     Objective: Vital Signs: There were no vitals taken for this visit.  Physical Exam Cardiovascular:     Rate and Rhythm: Normal rate.     Pulses: Normal pulses.  Pulmonary:     Effort: Pulmonary effort is normal.   Skin:    General: Skin is warm and dry.     Capillary Refill: Capillary refill takes less than 2 seconds.  Neurological:     Mental Status: He is alert.    Right Hand Exam   Tenderness  Right hand tenderness location: TTP along ECU tendon.  Other  Erythema: absent Sensation: normal Pulse: present  Comments:  TTP along ECU sheath.  ECU instability with dart thrower's motion.  Mild pain w/ ECU synergy test.  Non TTP at fovea.  Min pain w/ ulnocarpal loading.      Specialty Comments:  No specialty comments available.  Imaging: No results found.   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  No past surgical history on file. Social History   Occupational History   Not on file  Tobacco Use   Smoking status: Never   Smokeless tobacco: Never  Substance and Sexual Activity   Alcohol use: No   Drug use: No   Sexual activity: Yes

## 2020-10-29 ENCOUNTER — Ambulatory Visit
Admission: RE | Admit: 2020-10-29 | Discharge: 2020-10-29 | Disposition: A | Discharge status: Home / Self Care | Payer: Medicaid Other | Source: Ambulatory Visit | Attending: Orthopedic Surgery | Admitting: Orthopedic Surgery

## 2020-10-29 ENCOUNTER — Other Ambulatory Visit: Payer: Medicaid Other

## 2020-10-29 DIAGNOSIS — M25539 Pain in unspecified wrist: Secondary | ICD-10-CM

## 2020-10-29 IMAGING — MR MR WRIST*R* W/O CM
7 series · 40 of 40 positions shown · non-contrast
Comparison: CT right forearm dated [DATE]. Right wrist
x-rays dated [DATE].

CLINICAL DATA: Right wrist pain and weakness for the past month. No
injury or prior surgery.

EXAM:
MR OF THE RIGHT WRIST WITHOUT CONTRAST
TECHNIQUE: Multiplanar, multisequence MR imaging of the right wrist was
performed. No intravenous contrast was administered.

[Series 6: T2 fat-sat · axial · right · 3.0mm · 0.34mm/px · z∈[-15,+59]mm · 7 of 25 slices shown (1 of 2)]
[im 1/25]
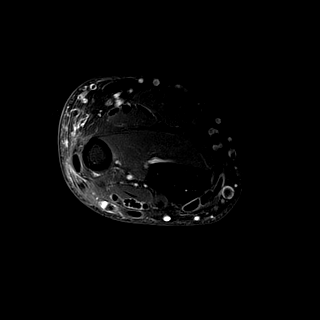
[im 5/25]
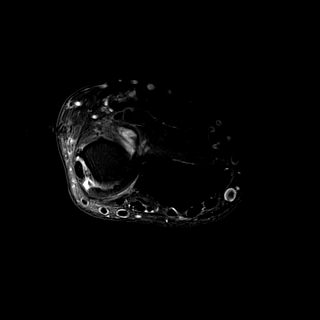
[im 9/25]
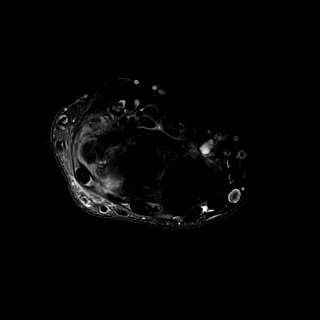
[im 13/25]
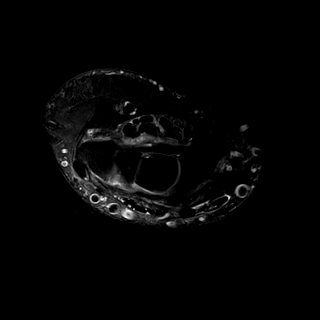
[im 17/25]
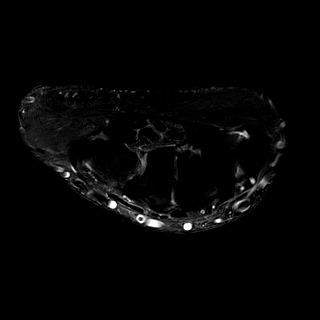
[im 21/25]
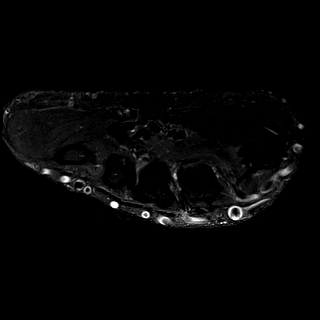
[im 25/25]
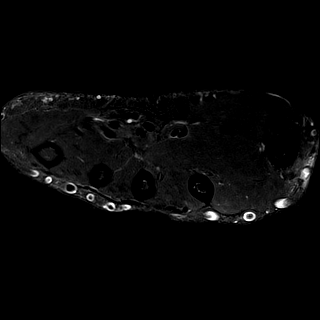

[Series 7: T1 · axial · right · 3.0mm · 0.34mm/px · z∈[-15,+59]mm · 7 of 25 slices shown (1 of 2)]
[im 1/25]
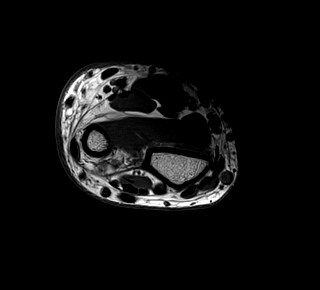
[im 5/25]
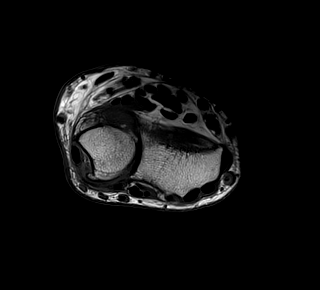
[im 9/25]
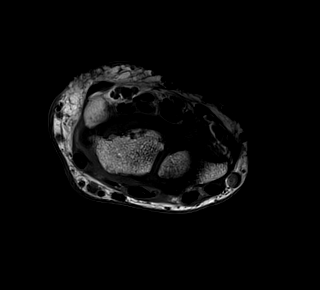
[im 13/25]
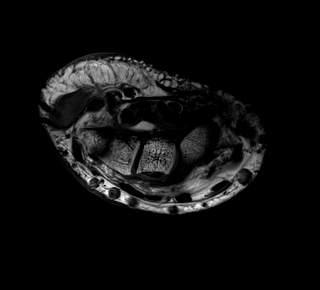
[im 17/25]
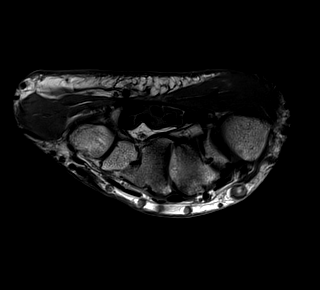
[im 21/25]
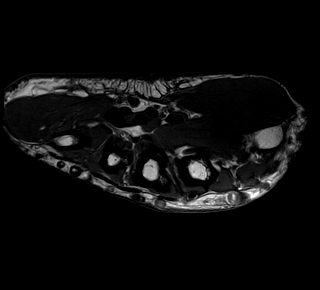
[im 25/25]
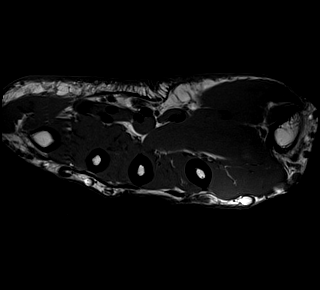

[Series 8: T1 · coronal · right · 3.0mm · 0.23mm/px · 5 of 17 slices shown (2 of 2)]
[im 1/17]
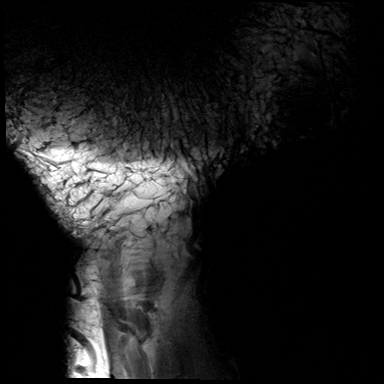
[im 5/17]
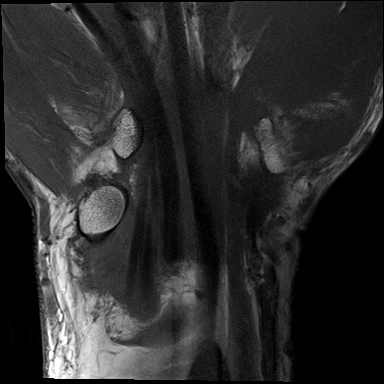
[im 9/17]
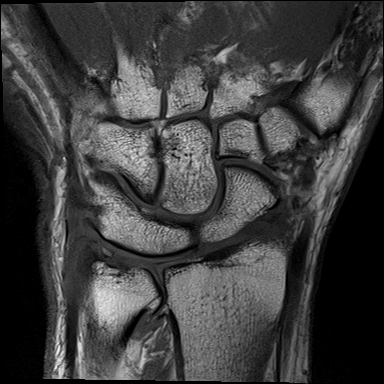
[im 13/17]
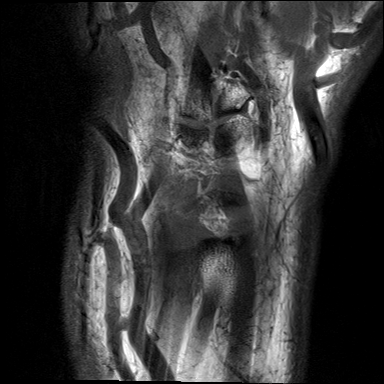
[im 17/17]
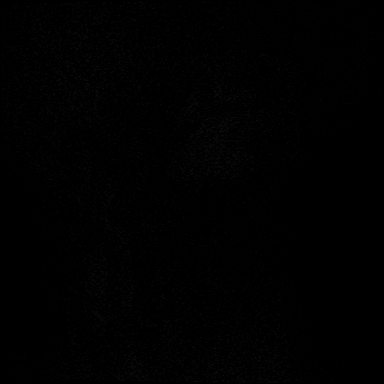

[Series 9: T2 fat-sat · coronal · right · 3.0mm · 0.23mm/px · 5 of 17 slices shown (2 of 2)]
[im 1/17]
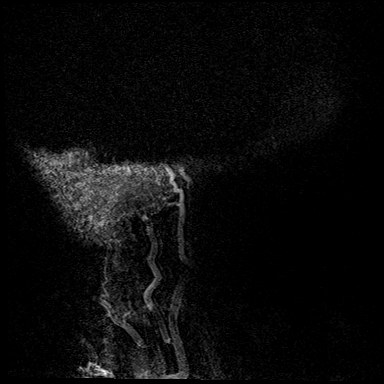
[im 5/17]
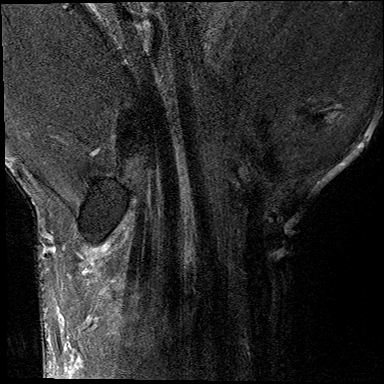
[im 9/17]
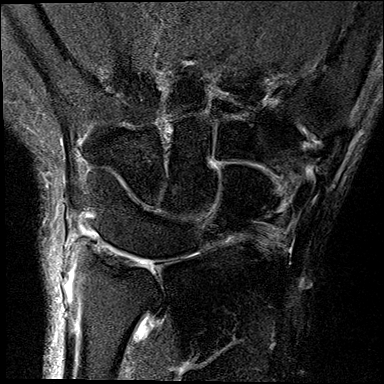
[im 13/17]
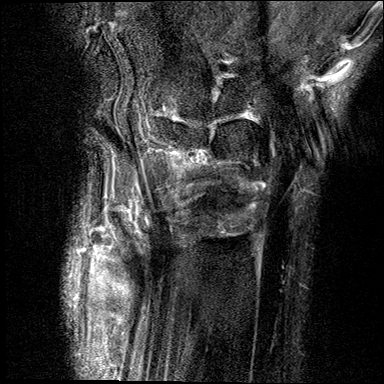
[im 17/17]
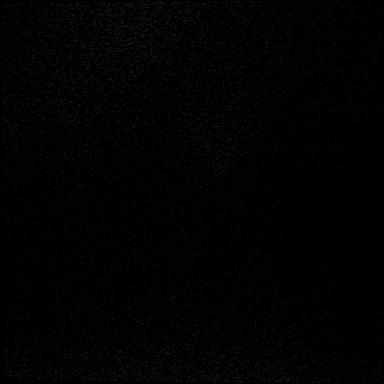

[Series 10: STIR · coronal · right · 3.0mm · 0.31mm/px · 4 of 15 slices shown]
[im 1/15]
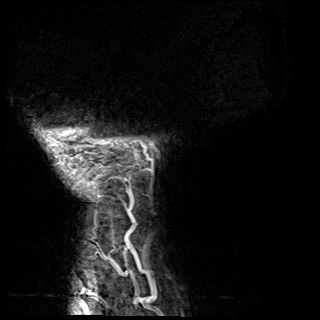
[im 5/15]
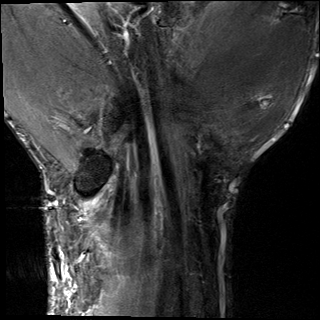
[im 10/15]
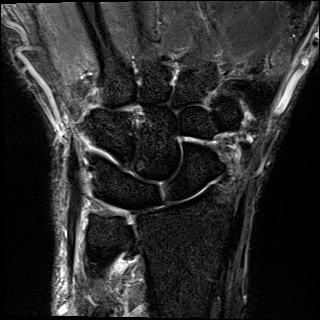
[im 15/15]
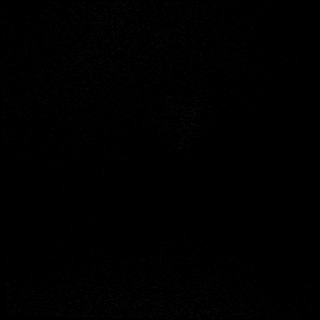

[Series 11: PD fat-sat · sagittal · right · 3.0mm · 0.31mm/px · 7 of 23 slices shown (1 of 2)]
[im 1/23]
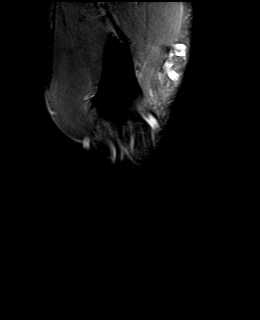
[im 4/23]
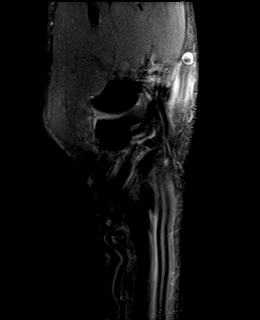
[im 8/23]
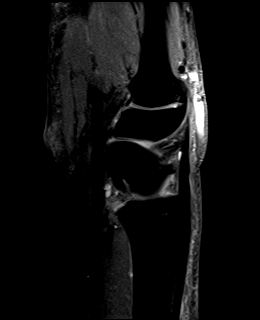
[im 12/23]
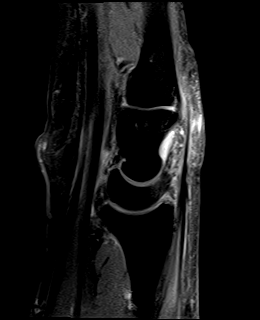
[im 15/23]
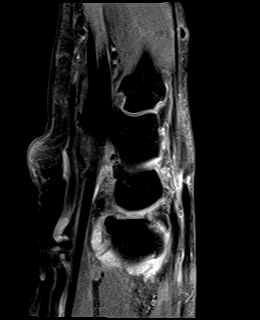
[im 19/23]
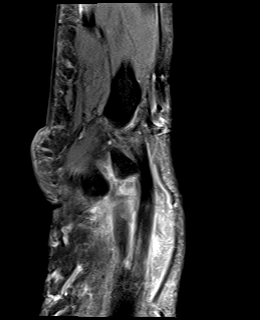
[im 23/23]
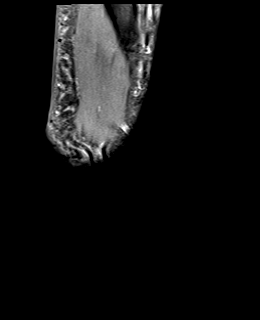

[Series 12: PD fat-sat · coronal · right · 3.0mm · 0.31mm/px · 5 of 17 slices shown (2 of 2)]
[im 1/17]
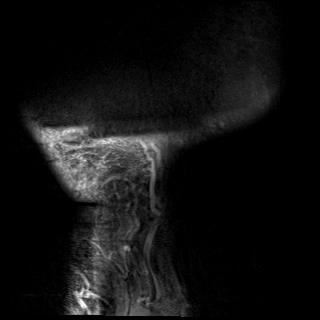
[im 5/17]
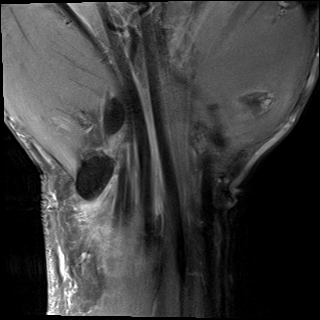
[im 9/17]
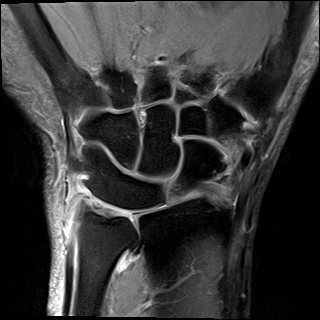
[im 13/17]
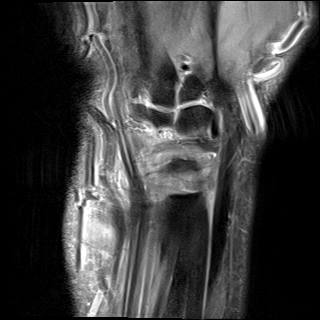
[im 17/17]
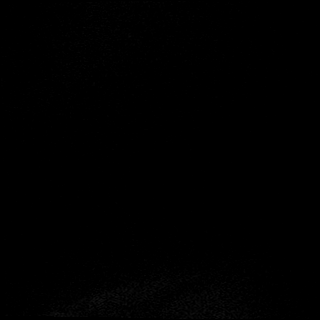

[40 of 40 positions shown; findings below may reference images not displayed]

FINDINGS: Ligaments: Intact scapholunate ligament.

Triangular fibrocartilage: Degenerated TFCC with full-thickness tear
of the articular disc.

Tendons: Intact flexor and extensor compartment tendons. Mild
extensor carpi ulnaris tendinosis.

Carpal tunnel/median nerve: Normal carpal tunnel. Normal median
nerve.

Guyon's canal: Normal.

Joint/cartilage: Distal radioulnar joint osteoarthritis with small
joint effusion and synovitis.

Bones/carpal alignment: No marrow signal abnormality. Normal
alignment. No aggressive osseous lesion. Lunotriquetral coalition
again noted.

Other: None.
IMPRESSION: 1. Degenerated TFCC with full-thickness tear of the articular disc.
2. Distal radioulnar joint osteoarthritis.

## 2020-11-01 ENCOUNTER — Encounter: Payer: Self-pay | Admitting: Orthopedic Surgery

## 2020-11-01 ENCOUNTER — Other Ambulatory Visit: Payer: Self-pay

## 2020-11-01 ENCOUNTER — Ambulatory Visit (INDEPENDENT_AMBULATORY_CARE_PROVIDER_SITE_OTHER): Payer: Medicaid Other | Admitting: Orthopedic Surgery

## 2020-11-01 VITALS — BP 143/81 | HR 97 | Ht 68.0 in | Wt 207.2 lb

## 2020-11-01 DIAGNOSIS — M19131 Post-traumatic osteoarthritis, right wrist: Secondary | ICD-10-CM | POA: Insufficient documentation

## 2020-11-01 DIAGNOSIS — M19031 Primary osteoarthritis, right wrist: Secondary | ICD-10-CM | POA: Diagnosis not present

## 2020-11-01 MED ORDER — LIDOCAINE HCL 1 % IJ SOLN
1.0000 mL | INTRAMUSCULAR | Status: AC | PRN
  Administered 2020-11-01: 1 mL

## 2020-11-01 MED ORDER — BETAMETHASONE SOD PHOS & ACET 6 (3-3) MG/ML IJ SUSP
6.0000 mg | INTRAMUSCULAR | Status: AC | PRN
  Administered 2020-11-01: 6 mg via INTRA_ARTICULAR
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
## 2020-11-08 ENCOUNTER — Other Ambulatory Visit: Payer: Medicaid Other

## 2020-11-14 ENCOUNTER — Emergency Department (HOSPITAL_COMMUNITY)
Admission: EM | Admit: 2020-11-14 | Discharge: 2020-11-14 | Disposition: A | Discharge status: Home / Self Care | Payer: Medicaid Other | Attending: Emergency Medicine | Admitting: Emergency Medicine

## 2020-11-14 ENCOUNTER — Encounter (HOSPITAL_COMMUNITY): Payer: Self-pay | Admitting: Pharmacy Technician

## 2020-11-14 ENCOUNTER — Other Ambulatory Visit: Payer: Self-pay

## 2020-11-14 ENCOUNTER — Emergency Department (HOSPITAL_COMMUNITY): Payer: Medicaid Other

## 2020-11-14 DIAGNOSIS — R42 Dizziness and giddiness: Secondary | ICD-10-CM | POA: Diagnosis not present

## 2020-11-14 DIAGNOSIS — M542 Cervicalgia: Secondary | ICD-10-CM | POA: Diagnosis not present

## 2020-11-14 DIAGNOSIS — M25531 Pain in right wrist: Secondary | ICD-10-CM | POA: Insufficient documentation

## 2020-11-14 DIAGNOSIS — M79601 Pain in right arm: Secondary | ICD-10-CM | POA: Insufficient documentation

## 2020-11-14 DIAGNOSIS — M25511 Pain in right shoulder: Secondary | ICD-10-CM | POA: Diagnosis not present

## 2020-11-14 DIAGNOSIS — Z79899 Other long term (current) drug therapy: Secondary | ICD-10-CM | POA: Diagnosis not present

## 2020-11-14 DIAGNOSIS — M25521 Pain in right elbow: Secondary | ICD-10-CM | POA: Insufficient documentation

## 2020-11-14 DIAGNOSIS — E119 Type 2 diabetes mellitus without complications: Secondary | ICD-10-CM | POA: Insufficient documentation

## 2020-11-14 DIAGNOSIS — I10 Essential (primary) hypertension: Secondary | ICD-10-CM | POA: Insufficient documentation

## 2020-11-14 DIAGNOSIS — Z7984 Long term (current) use of oral hypoglycemic drugs: Secondary | ICD-10-CM | POA: Diagnosis not present

## 2020-11-14 LAB — BASIC METABOLIC PANEL
Anion gap: 12 (ref 5–15)
BUN: 13 mg/dL (ref 6–20)
CO2: 20 mmol/L — ABNORMAL LOW (ref 22–32)
Calcium: 9.1 mg/dL (ref 8.9–10.3)
Chloride: 104 mmol/L (ref 98–111)
Creatinine, Ser: 0.98 mg/dL (ref 0.61–1.24)
GFR, Estimated: >60 mL/min (ref >60)
Glucose, Bld: 187 mg/dL — ABNORMAL HIGH (ref 70–99)
Potassium: 3.7 mmol/L (ref 3.5–5.1)
Sodium: 136 mmol/L (ref 135–145)

## 2020-11-14 LAB — CBC
HCT: 49 % (ref 39.0–52.0)
Hemoglobin: 15.4 g/dL (ref 13.0–17.0)
MCH: 27.9 pg (ref 26.0–34.0)
MCHC: 31.4 g/dL (ref 30.0–36.0)
MCV: 88.9 fL (ref 80.0–100.0)
Platelets: 211 10*3/uL (ref 150–400)
RBC: 5.51 MIL/uL (ref 4.22–5.81)
RDW: 13.4 % (ref 11.5–15.5)
WBC: 4.3 10*3/uL (ref 4.0–10.5)
nRBC: 0 % (ref 0.0–0.2)

## 2020-11-14 IMAGING — CR DG ELBOW COMPLETE 3+V*R*
4 series · 4 of 4 positions shown · non-contrast
Comparison: None.

CLINICAL DATA: Pain.  Injury [DATE]

EXAM:
RIGHT ELBOW - COMPLETE 3+ VIEW

[elbow ap]
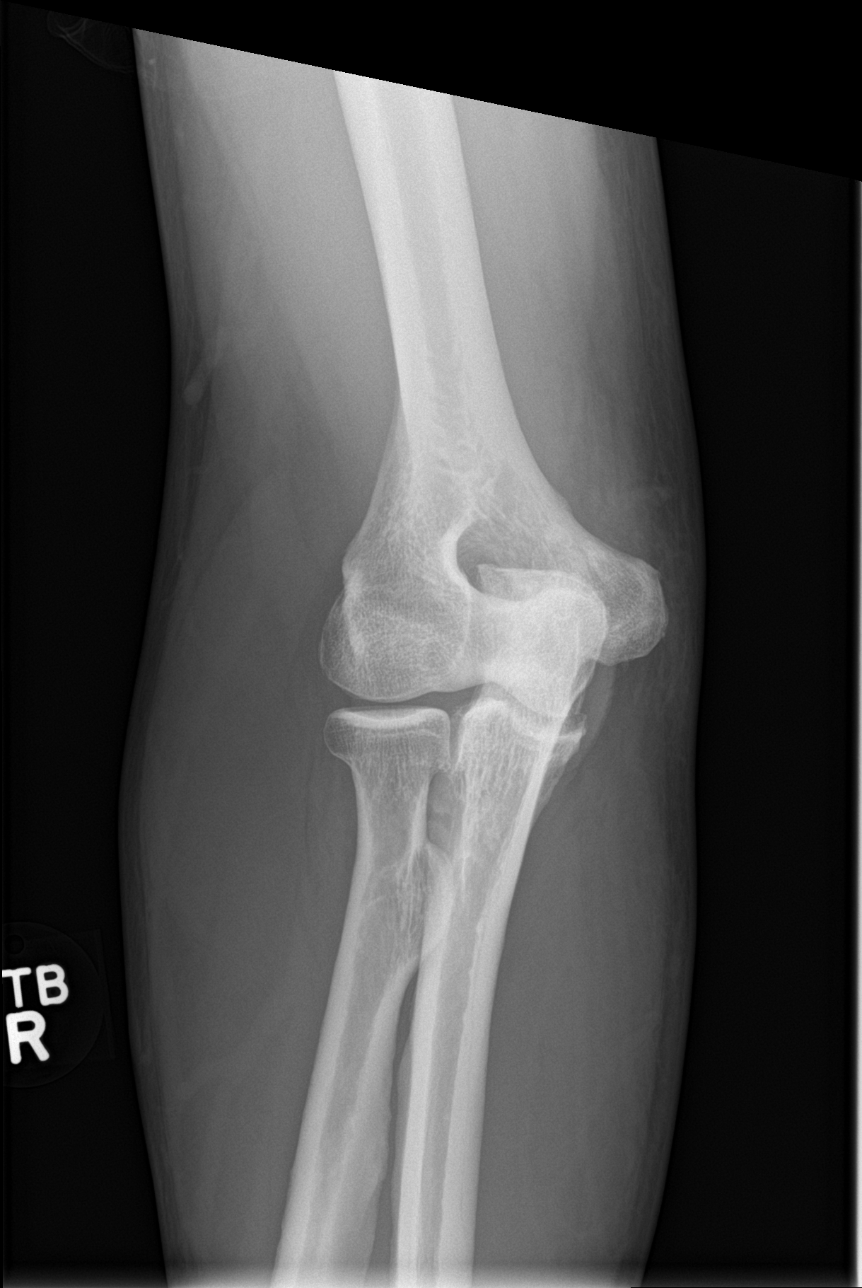

[elbow obl (1 of 2)]
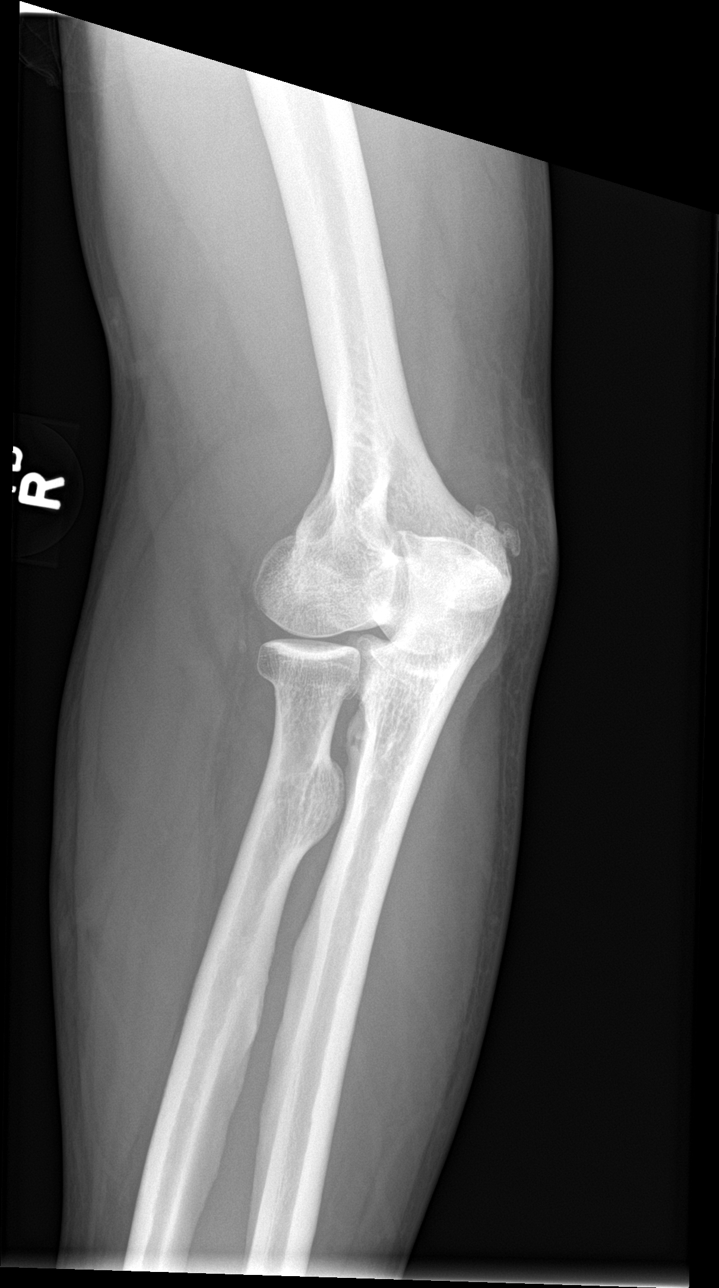

[elbow obl (2 of 2)]
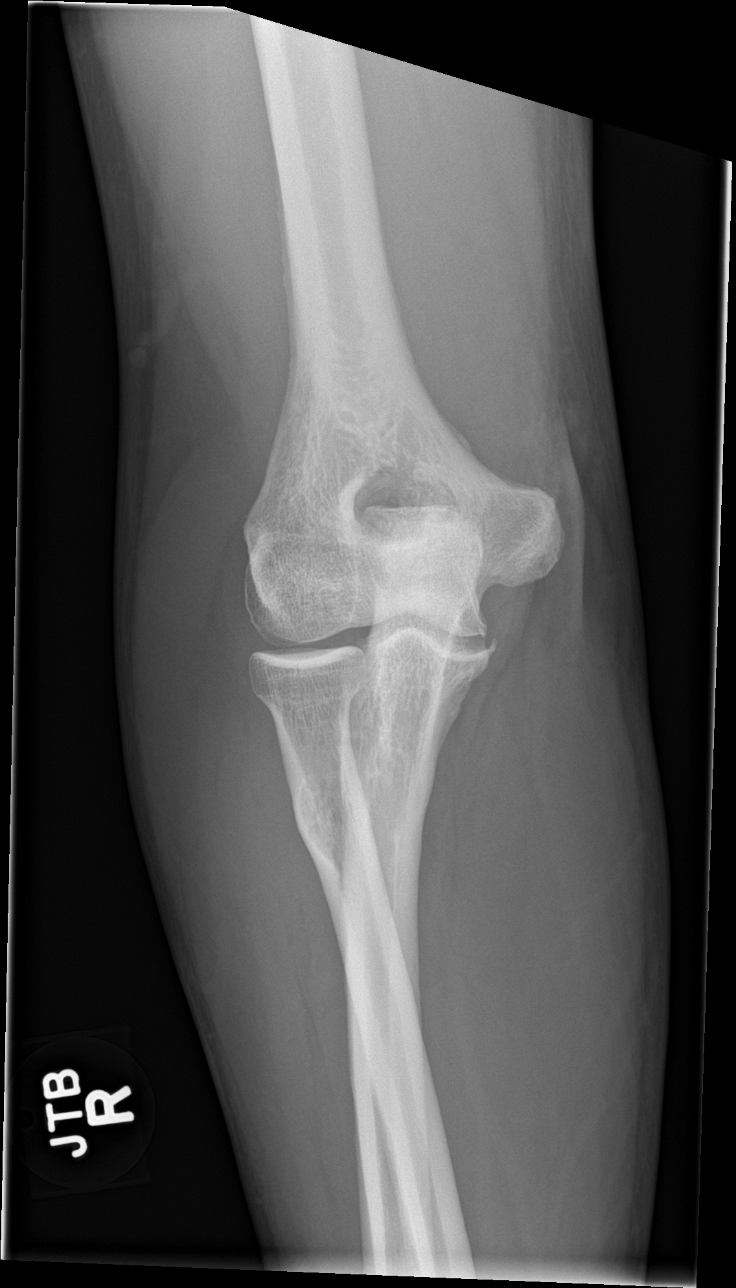

[elbow lat]
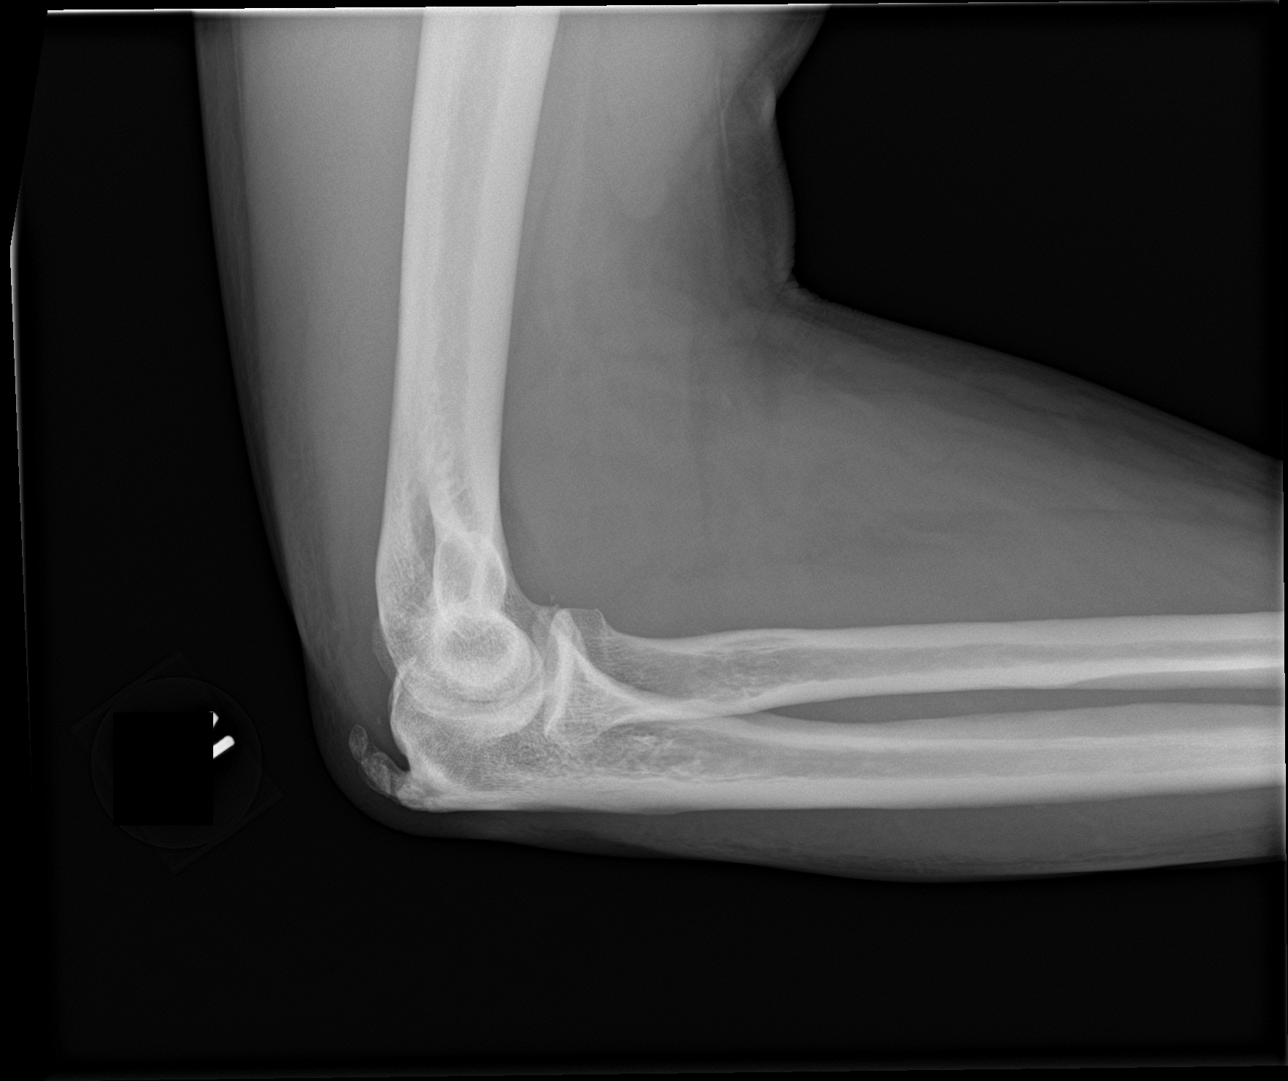

[4 of 4 positions shown; findings below may reference images not displayed]

FINDINGS: Normal alignment.  Negative for fracture.

Mild spurring of the coronoid process. Calcification at the triceps
insertion. No joint effusion
IMPRESSION: Negative for fracture

## 2020-11-14 IMAGING — CR DG SHOULDER 2+V*R*
3 series · 3 of 3 positions shown · non-contrast
Comparison: None.

CLINICAL DATA: Shoulder pain.  Injury [DATE]

EXAM:
RIGHT SHOULDER - 2+ VIEW

[shoulder grashey]
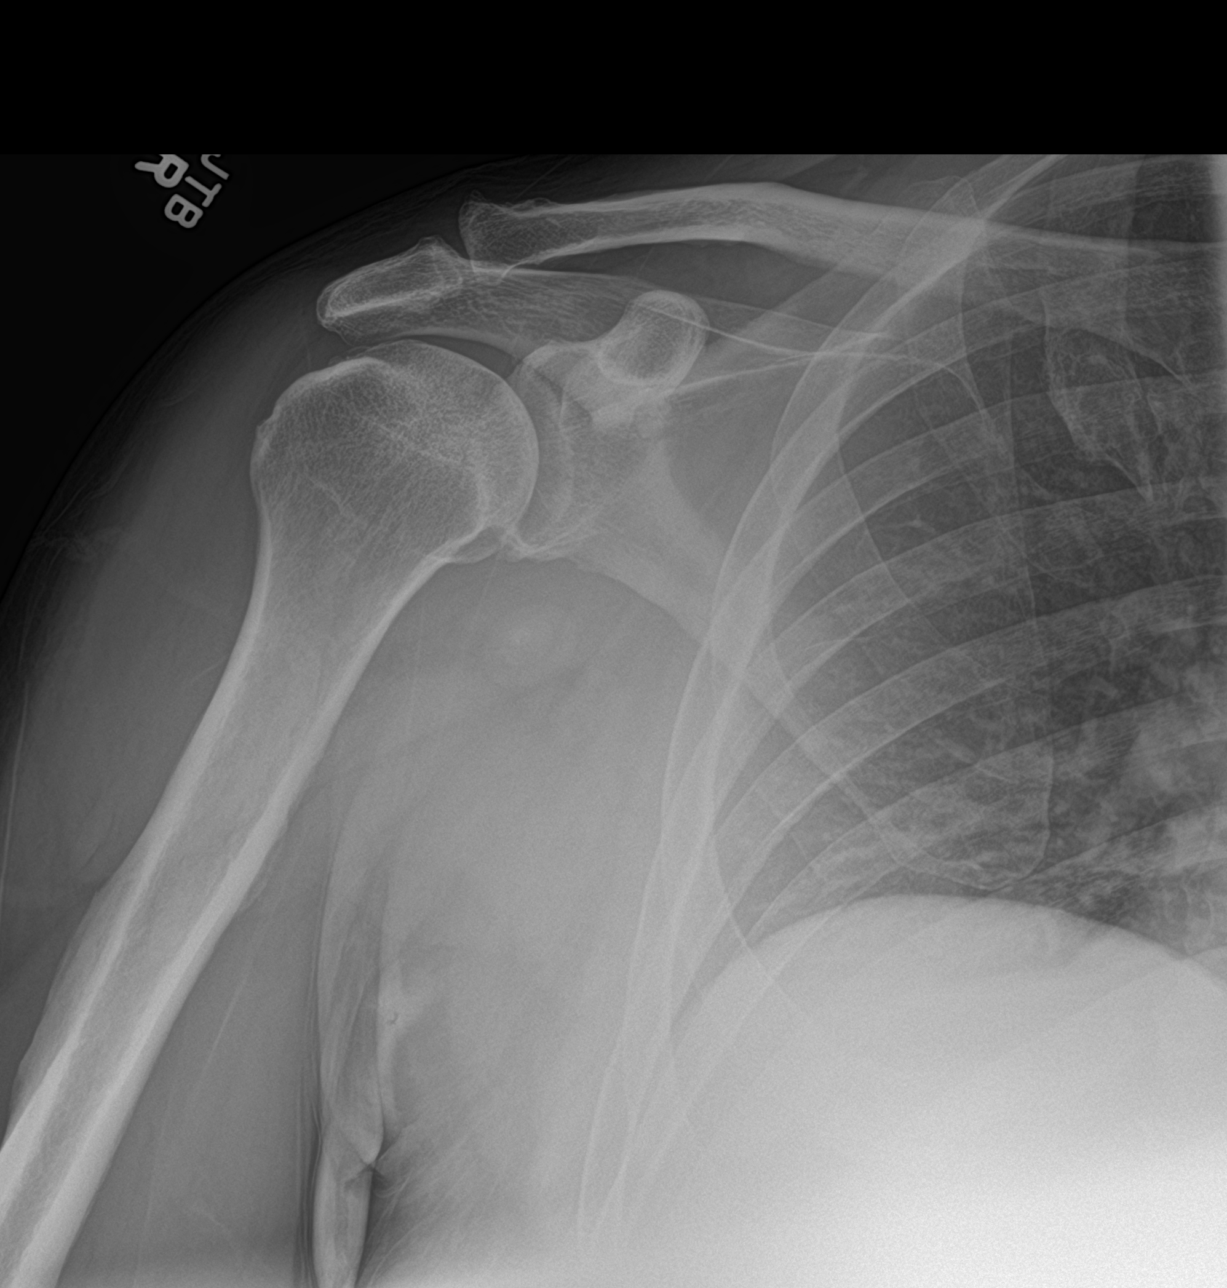

[shoulder axillary]
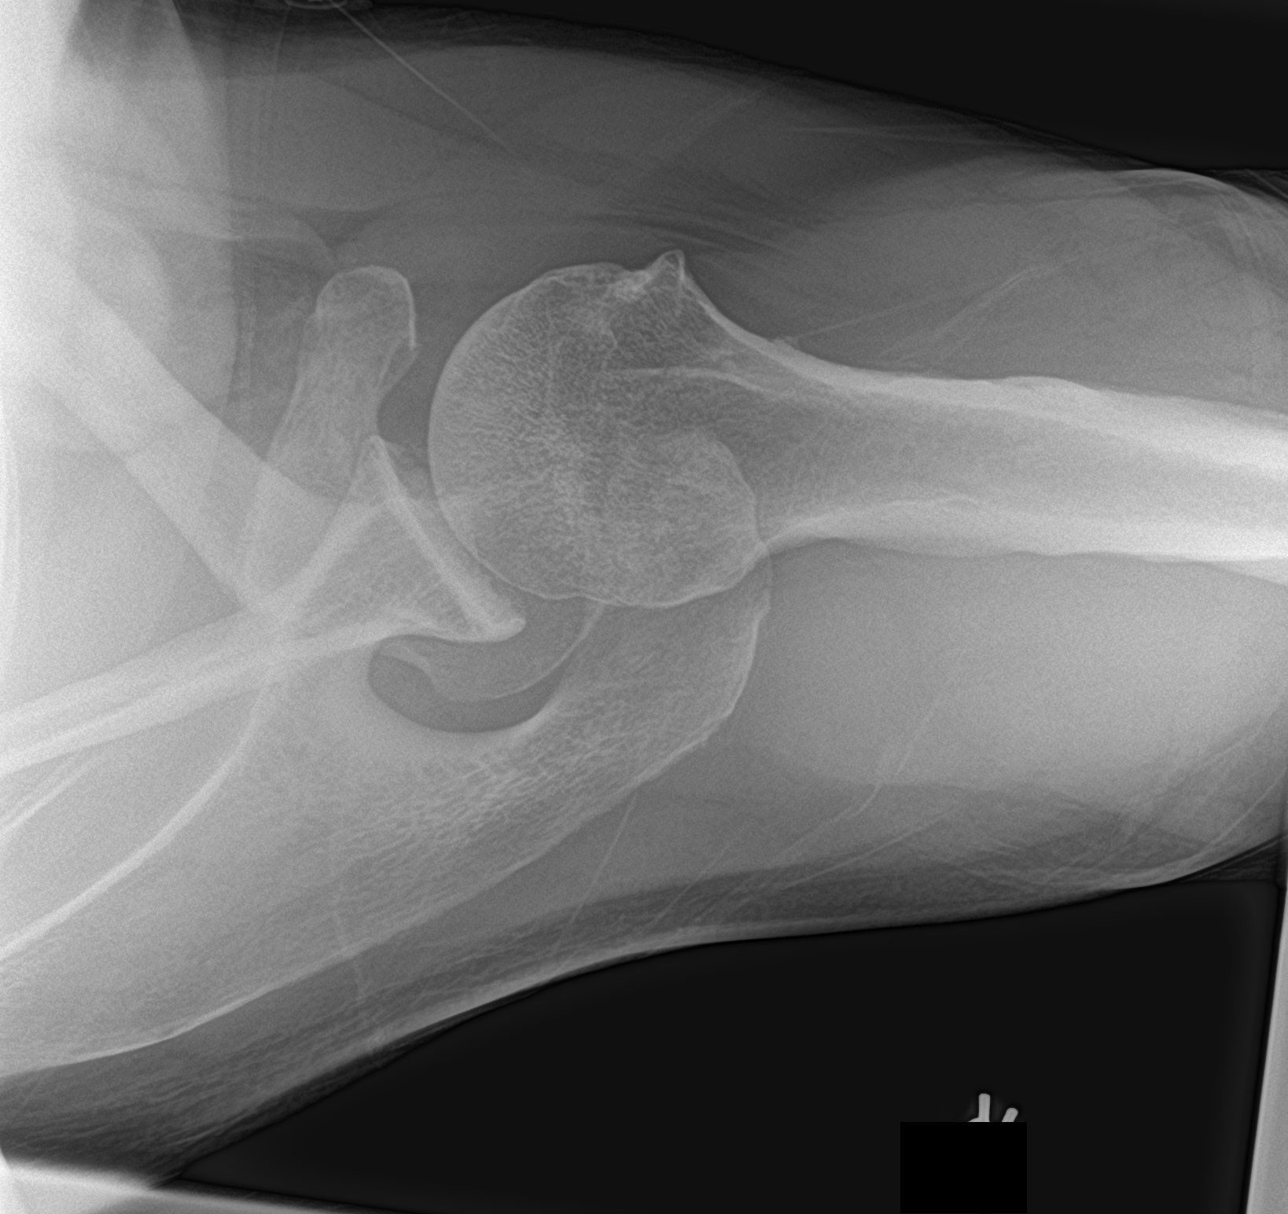

[shoulder y view]
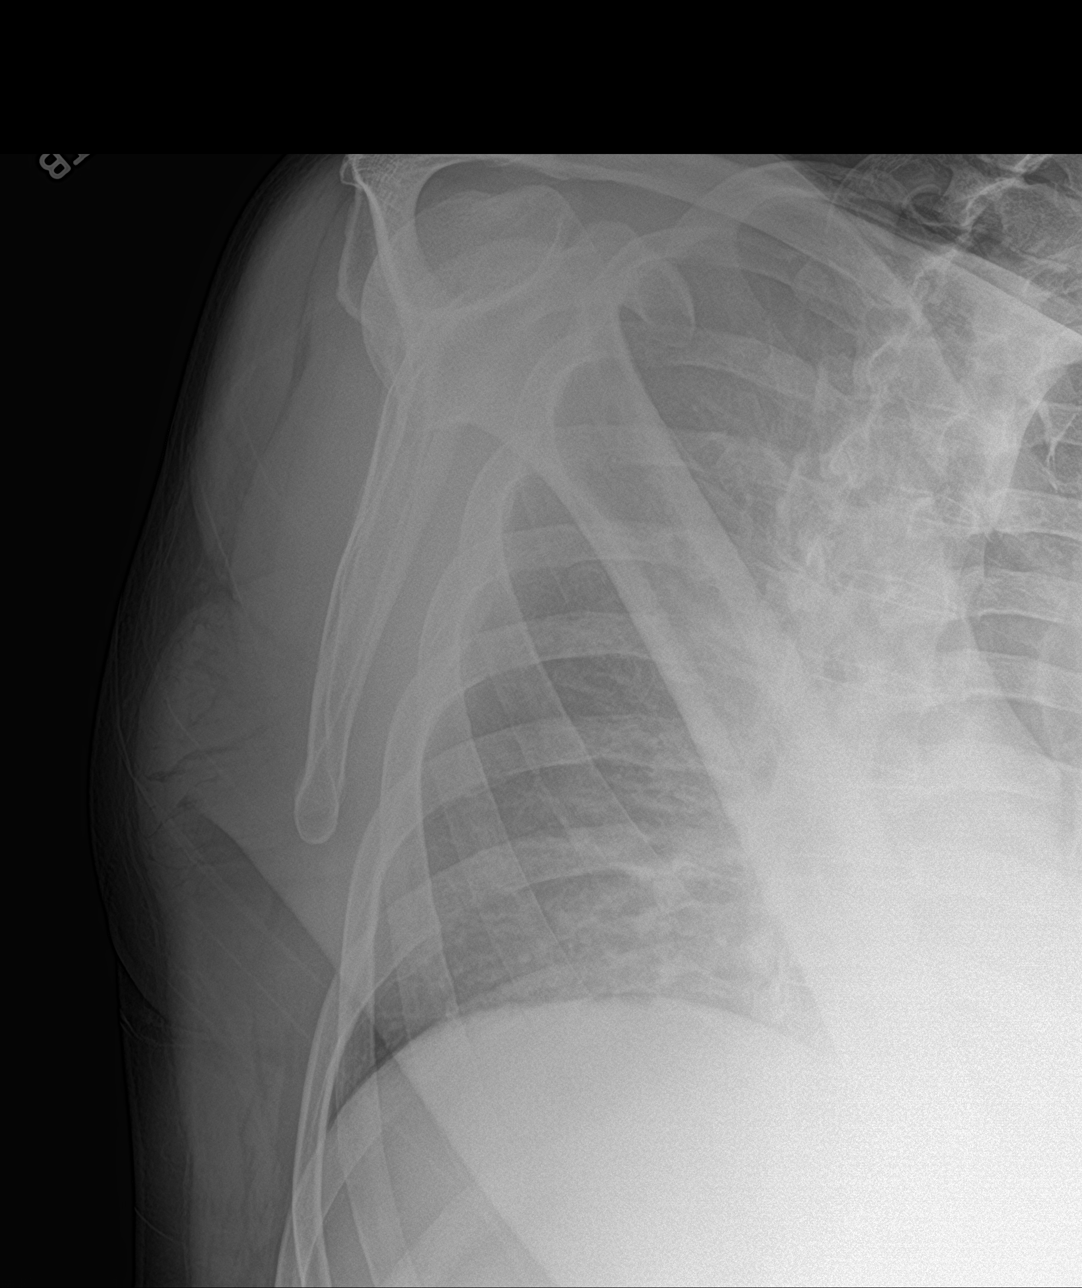

[3 of 3 positions shown; findings below may reference images not displayed]

FINDINGS: There is no evidence of fracture or dislocation. There is no
evidence of arthropathy or other focal bone abnormality. Soft
tissues are unremarkable.
IMPRESSION: Negative.

## 2020-11-14 MED ORDER — KETOROLAC TROMETHAMINE 60 MG/2ML IM SOLN
60.0000 mg | Freq: Once | INTRAMUSCULAR | Status: AC
  Administered 2020-11-14: 60 mg via INTRAMUSCULAR
  Filled 2020-11-14: qty 2

## 2020-11-14 MED ORDER — CYCLOBENZAPRINE HCL 10 MG PO TABS: 10.0000 mg | ORAL_TABLET | Freq: Two times a day (BID) | ORAL | 0 refills | Status: AC | PRN

## 2020-11-14 MED ORDER — DIAZEPAM 5 MG PO TABS
5.0000 mg | ORAL_TABLET | Freq: Once | ORAL | Status: AC
  Administered 2020-11-14: 5 mg via ORAL
  Filled 2020-11-14: qty 1

## 2020-11-14 NOTE — ED Notes (Signed)
Pt verbalized understanding of d/c instructions, meds and followup care. Denies questions. VSS, no distress noted. W/C to exit with all belongings.  

## 2020-11-14 NOTE — ED Triage Notes (Signed)
Pt here with reports of R arm pain after closing his arm in the door last month. Pt also complains of feeling "lightheaded" intermittently since then.

## 2020-11-14 NOTE — ED Provider Notes (Signed)
[facility] EMERGENCY DEPARTMENT Provider Note   CSN: [id number] Arrival date & time: 11/14/20  0840     History Chief Complaint  Patient presents with   Arm Pain   Dizziness    Robert Hooper is a 55 y.o. male.  HPI    55 year old male with a history of bipolar, diabetes, hypertension, diagnosis of distal radial ulnar joint posttraumatic arthritis with orthopedics, who presents with concern for continued right arm pain after he closed a door on his forearm September 26.  9/26 closed door on right forearm, has had continuing pain in the right arm, located forearm, elbow, and now in right shoulder and neck as well Feels tightness in forearm , if relax and don't do too much it is better. Difficult because right handed. Pain worse around the right elbow.  No fevers.  Some headaches. Occasional vomiting.  No chest pain or dyspnea.  Was feeling lightheadedness, can be worse with moving around.  No abdominal pain.  Not on blood thinners. No other falls or injuries.   My initial history reports that this injury happened in September 26, and he had noted they long wait times in the emergency department so had waited to be seen.  Chart review does show that he had seen orthopedics regarding this, and reported injury 1 month prior to his evaluation at the end of September.  He then states he had been seen by them, had MRI wrist, has been followed but now pain has spread to other joints, located in elbow and shoulder and he has not spoken to orthopedics about that.  Has been taking oxycodone, ibuprofen without significant relief.    Past Medical History:  Diagnosis Date   Arthritis    Back pain    chronic   Bipolar 1 disorder (HCC)    Depression    Diabetes mellitus without complication (HCC)    Hemorrhoids    Hypertension     Patient Active Problem List   Diagnosis Date Noted   DRUJ (distal radioulnar joint) post-traumatic arthritis, right 11/01/2020   Pain of  ulnar side of wrist 10/25/2020   Diabetes mellitus without complication (HCC) 10/01/2020   Nuclear sclerotic cataract of left eye 10/01/2020   Age-related nuclear cataract of right eye 10/01/2020   Vitreomacular adhesion of both eyes 10/01/2020   Pemphigus vulgaris 06/23/2019   Mucosal irritation of oral cavity 04/13/2019   Epistaxis, recurrent 02/13/2019   Pharyngitis 02/13/2019   Internal hemorrhoid, bleeding 09/20/2012    History reviewed. No pertinent surgical history.     No family history on file.  Social History   Tobacco Use   Smoking status: Never   Smokeless tobacco: Never  Substance Use Topics   Alcohol use: No   Drug use: No    Home Medications Prior to Admission medications   Medication Sig Start Date End Date Taking? Authorizing Provider  cyclobenzaprine (FLEXERIL) 10 MG tablet Take 1 tablet (10 mg total) by mouth 2 (two) times daily as needed for muscle spasms. 11/14/20  Yes Alvira Monday, MD  ALPRAZolam Prudy Feeler) 1 MG tablet Take 1 mg by mouth 4 (four) times daily. 03/14/20   [provider]  amphetamine-dextroamphetamine (ADDERALL) 30 MG tablet Take 1 tablet by mouth 2 (two) times daily. 02/28/20   [provider]  FARXIGA 10 MG TABS tablet Take 10 mg by mouth daily. 10/30/19   [provider]  glimepiride (AMARYL) 2 MG tablet Take 2 mg by mouth daily. 12/15/19   [provider]  ibuprofen (ADVIL) 800 MG tablet Take 800 mg by mouth 2 (two) times daily as needed. 12/18/19   [provider]  ibuprofen (ADVIL) 800 MG tablet Take 1 tablet (800 mg total) by mouth 3 (three) times daily. 09/27/20   Arthor Captain, PA-C  lisinopril (PRINIVIL,ZESTRIL) 10 MG tablet Take 10 mg by mouth daily.      [provider]  metFORMIN (GLUCOPHAGE) 1000 MG tablet Take 1,000 mg by mouth 2 (two) times daily. 12/12/19   [provider]  Multiple Vitamin (MULTIVITAMIN WITH MINERALS) TABS Take 1 tablet by mouth daily.     [provider]  mycophenolate (CELLCEPT) 500 MG tablet Take 1,000 mg by mouth 2 (two) times daily. 01/03/20   [provider]  predniSONE (DELTASONE) 10 MG tablet Take 40 mg by mouth daily. 11/30/19   [provider]  QUEtiapine (SEROQUEL) 400 MG tablet Take 400 mg by mouth at bedtime. 02/23/20   [provider]    Allergies    Lactose intolerance (gi) and Penicillins  Review of Systems   Review of Systems  Constitutional:  Negative for fever.  HENT:  Negative for sore throat.   Eyes:  Negative for visual disturbance.  Respiratory:  Negative for shortness of breath.   Cardiovascular:  Negative for chest pain.  Gastrointestinal:  Negative for abdominal pain, nausea and vomiting.  Genitourinary:  Negative for difficulty urinating.  Musculoskeletal:  Positive for arthralgias and neck pain. Negative for back pain.  Skin:  Negative for rash.  Neurological:  Negative for syncope and headaches.   Physical Exam Updated Vital Signs BP 125/88   Pulse 83   Temp 98.1 F (36.7 C) (Oral)   Resp 17   SpO2 94%   Physical Exam Vitals and nursing note reviewed.  Constitutional:      General: He is not in acute distress.    Appearance: He is well-developed. He is not diaphoretic.  HENT:     Head: Normocephalic and atraumatic.  Eyes:     Conjunctiva/sclera: Conjunctivae normal.  Cardiovascular:     Rate and Rhythm: Normal rate and regular rhythm.     Heart sounds: Normal heart sounds. No murmur heard.   No friction rub. No gallop.  Pulmonary:     Effort: Pulmonary effort is normal. No respiratory distress.     Breath sounds: Normal breath sounds. No wheezing or rales.  Abdominal:     General: There is no distension.     Palpations: Abdomen is soft.     Tenderness: There is no abdominal tenderness. There is no guarding.  Musculoskeletal:        General: Tenderness (right elbow, right shoulder, right wrist) present.     Cervical back: Normal range of  motion.  Skin:    General: Skin is warm and dry.  Neurological:     Mental Status: He is alert and oriented to person, place, and time.    ED Results / Procedures / Treatments   Labs (all labs ordered are listed, but only abnormal results are displayed) Labs Reviewed  BASIC METABOLIC PANEL - Abnormal; Notable for the following components:      Result Value   CO2 20 (*)    Glucose, Bld 187 (*)    All other components within normal limits  CBC    EKG EKG Interpretation  Date/Time:  Thursday November 14 2020 08:44:17 EDT Ventricular Rate:  88 PR Interval:  192 QRS Duration: 76 QT Interval:  334 QTC  Calculation: 404 R Axis:   24 Text Interpretation: Normal sinus rhythm Low voltage QRS Borderline ECG No significant change since last tracing Confirmed by Alvira Monday ([id number]) on 11/14/2020 9:47:29 AM  Radiology DG Shoulder Right  Result Date: 11/14/2020 CLINICAL DATA:  Shoulder pain.  Injury 10/28/2020 EXAM: RIGHT SHOULDER - 2+ VIEW COMPARISON:  None. FINDINGS: There is no evidence of fracture or dislocation. There is no evidence of arthropathy or other focal bone abnormality. Soft tissues are unremarkable. IMPRESSION: Negative. Electronically Signed   By: Marlan Palau M.D.   On: 11/14/2020 10:07   DG Elbow Complete Right  Result Date: 11/14/2020 CLINICAL DATA:  Pain.  Injury 10/28/2020 EXAM: RIGHT ELBOW - COMPLETE 3+ VIEW COMPARISON:  None. FINDINGS: Normal alignment.  Negative for fracture. Mild spurring of the coronoid process. Calcification at the triceps insertion. No joint effusion IMPRESSION: Negative for fracture Electronically Signed   By: Marlan Palau M.D.   On: 11/14/2020 10:08    Procedures Procedures   Medications Ordered in ED Medications  ketorolac (TORADOL) injection 60 mg (60 mg Intramuscular Given 11/14/20 1143)  diazepam (VALIUM) tablet 5 mg (5 mg Oral Given 11/14/20 1144)    ED Course  I have reviewed the triage vital signs and the nursing  notes.  Pertinent labs & imaging results that were available during my care of the patient were reviewed by me and considered in my medical decision making (see chart for details).    MDM Rules/Calculators/A&P                            55 year old male with a history of bipolar, diabetes, hypertension, diagnosis of distal radial ulnar joint posttraumatic arthritis with orthopedics, who presents with concern for continued right arm pain after he closed a door on his forearm September 26.  Given continued and full arm pain with dizziness on history at triage, had evaluation ordered at triage that included CBC, BMP, troponin and EKG.  EKG without significant abnormalities.  CBC without anemia.  No significant electrolyte abnormalities on BMP.  He does not have any abdominal pain or tenderness on my exam and have low suspicion for intra-abdominal cause of right shoulder and arm pain.  Have low suspicion for thoracic etiology of right shoulder and arm pain given pain is reproducible on exam.  In the setting of normal troponin low suspicoin for ACS.  X-ray of the shoulder and elbow completed in triage are negative for acute fracture.  He has good pulses bilaterally, no sign of acute arterial thrombus.  Have low suspicion for dissection.  No asymmetric swelling, and pain localized over joint and have low suspicion for DVT.He does not have signs of epidural abscess, osteomyelitis, fracture, or cord compression on history or exam.  Has calcification of the triceps insertion on elbow x-ray.  Recommend continued follow-up with Orthopedics for his elbow pain and shoulder pain.  Also discussed possibility of cervical radiculopathy given pain initiating in the upper right shoulder, right side of the neck.  Given his diabetes history, at this time feel the risk of steroids outweighs benefits.  We will give prescription for muscle relaxant, recommend he continue his other medications and follow-up with  orthopedics/PCP.   Final Clinical Impression(s) / ED Diagnoses Final diagnoses:  Right arm pain  Acute pain of right shoulder  Right elbow pain  Right wrist pain    Rx / DC Orders ED Discharge Orders  Ordered    cyclobenzaprine (FLEXERIL) 10 MG tablet  2 times daily PRN        11/14/20 1123             Alvira Monday, MD 11/15/20 2140

## 2021-03-06 ENCOUNTER — Emergency Department (HOSPITAL_COMMUNITY): Payer: Medicaid Other

## 2021-03-06 ENCOUNTER — Emergency Department (HOSPITAL_COMMUNITY)
Admission: EM | Admit: 2021-03-06 | Discharge: 2021-03-06 | Disposition: A | Discharge status: Home / Self Care | Payer: Medicaid Other | Attending: Student | Admitting: Student

## 2021-03-06 DIAGNOSIS — M25561 Pain in right knee: Secondary | ICD-10-CM | POA: Diagnosis not present

## 2021-03-06 DIAGNOSIS — Z794 Long term (current) use of insulin: Secondary | ICD-10-CM | POA: Insufficient documentation

## 2021-03-06 DIAGNOSIS — S060X0A Concussion without loss of consciousness, initial encounter: Secondary | ICD-10-CM | POA: Diagnosis not present

## 2021-03-06 DIAGNOSIS — M542 Cervicalgia: Secondary | ICD-10-CM | POA: Insufficient documentation

## 2021-03-06 DIAGNOSIS — M25571 Pain in right ankle and joints of right foot: Secondary | ICD-10-CM | POA: Insufficient documentation

## 2021-03-06 DIAGNOSIS — Z79899 Other long term (current) drug therapy: Secondary | ICD-10-CM | POA: Insufficient documentation

## 2021-03-06 DIAGNOSIS — M25551 Pain in right hip: Secondary | ICD-10-CM | POA: Diagnosis not present

## 2021-03-06 DIAGNOSIS — I10 Essential (primary) hypertension: Secondary | ICD-10-CM | POA: Diagnosis not present

## 2021-03-06 DIAGNOSIS — Z7984 Long term (current) use of oral hypoglycemic drugs: Secondary | ICD-10-CM | POA: Diagnosis not present

## 2021-03-06 DIAGNOSIS — S0990XA Unspecified injury of head, initial encounter: Secondary | ICD-10-CM | POA: Diagnosis present

## 2021-03-06 DIAGNOSIS — E119 Type 2 diabetes mellitus without complications: Secondary | ICD-10-CM | POA: Diagnosis not present

## 2021-03-06 DIAGNOSIS — M545 Low back pain, unspecified: Secondary | ICD-10-CM | POA: Diagnosis not present

## 2021-03-06 DIAGNOSIS — Y92410 Unspecified street and highway as the place of occurrence of the external cause: Secondary | ICD-10-CM | POA: Diagnosis not present
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
## 2021-03-06 MED ORDER — KETOROLAC TROMETHAMINE 15 MG/ML IJ SOLN
15.0000 mg | Freq: Once | INTRAMUSCULAR | Status: AC
  Administered 2021-03-06: 15 mg via INTRAVENOUS
  Filled 2021-03-06: qty 1

## 2021-03-06 MED ORDER — PROCHLORPERAZINE EDISYLATE 10 MG/2ML IJ SOLN
10.0000 mg | Freq: Once | INTRAMUSCULAR | Status: AC
  Administered 2021-03-06: 10 mg via INTRAVENOUS
  Filled 2021-03-06: qty 2

## 2021-03-06 MED ORDER — NAPROXEN 375 MG PO TABS: 375.0000 mg | ORAL_TABLET | Freq: Two times a day (BID) | ORAL | 0 refills | Status: DC

## 2021-03-06 MED ORDER — DIPHENHYDRAMINE HCL 50 MG/ML IJ SOLN
25.0000 mg | Freq: Once | INTRAMUSCULAR | Status: AC
  Administered 2021-03-06: 25 mg via INTRAVENOUS
  Filled 2021-03-06: qty 1

## 2021-03-06 NOTE — ED Notes (Signed)
Refused to sign MSE waiver
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
## 2021-06-22 ENCOUNTER — Emergency Department (HOSPITAL_BASED_OUTPATIENT_CLINIC_OR_DEPARTMENT_OTHER)
Admission: EM | Admit: 2021-06-22 | Discharge: 2021-06-22 | Disposition: A | Discharge status: Home / Self Care | Payer: Medicaid Other | Attending: Emergency Medicine | Admitting: Emergency Medicine

## 2021-06-22 ENCOUNTER — Emergency Department (HOSPITAL_BASED_OUTPATIENT_CLINIC_OR_DEPARTMENT_OTHER): Payer: Medicaid Other

## 2021-06-22 ENCOUNTER — Encounter (HOSPITAL_BASED_OUTPATIENT_CLINIC_OR_DEPARTMENT_OTHER): Payer: Self-pay | Admitting: Emergency Medicine

## 2021-06-22 ENCOUNTER — Emergency Department (HOSPITAL_BASED_OUTPATIENT_CLINIC_OR_DEPARTMENT_OTHER): Payer: Medicaid Other | Admitting: Radiology

## 2021-06-22 ENCOUNTER — Other Ambulatory Visit: Payer: Self-pay

## 2021-06-22 DIAGNOSIS — Y92410 Unspecified street and highway as the place of occurrence of the external cause: Secondary | ICD-10-CM | POA: Insufficient documentation

## 2021-06-22 DIAGNOSIS — R112 Nausea with vomiting, unspecified: Secondary | ICD-10-CM | POA: Diagnosis not present

## 2021-06-22 DIAGNOSIS — R000 Tachycardia, unspecified: Secondary | ICD-10-CM | POA: Diagnosis not present

## 2021-06-22 DIAGNOSIS — M545 Low back pain, unspecified: Secondary | ICD-10-CM | POA: Insufficient documentation

## 2021-06-22 DIAGNOSIS — S060X0A Concussion without loss of consciousness, initial encounter: Secondary | ICD-10-CM | POA: Diagnosis not present

## 2021-06-22 DIAGNOSIS — Z79899 Other long term (current) drug therapy: Secondary | ICD-10-CM | POA: Diagnosis not present

## 2021-06-22 DIAGNOSIS — S86911A Strain of unspecified muscle(s) and tendon(s) at lower leg level, right leg, initial encounter: Secondary | ICD-10-CM

## 2021-06-22 DIAGNOSIS — R0789 Other chest pain: Secondary | ICD-10-CM | POA: Diagnosis not present

## 2021-06-22 DIAGNOSIS — S0990XA Unspecified injury of head, initial encounter: Secondary | ICD-10-CM | POA: Diagnosis present

## 2021-06-22 DIAGNOSIS — M542 Cervicalgia: Secondary | ICD-10-CM | POA: Diagnosis not present

## 2021-06-22 DIAGNOSIS — S46912A Strain of unspecified muscle, fascia and tendon at shoulder and upper arm level, left arm, initial encounter: Secondary | ICD-10-CM | POA: Diagnosis not present

## 2021-06-22 DIAGNOSIS — H538 Other visual disturbances: Secondary | ICD-10-CM | POA: Diagnosis not present

## 2021-06-22 DIAGNOSIS — S8391XA Sprain of unspecified site of right knee, initial encounter: Secondary | ICD-10-CM | POA: Insufficient documentation

## 2021-06-22 LAB — CBG MONITORING, ED: Glucose-Capillary: 154 mg/dL — ABNORMAL HIGH (ref 70–99)
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
## 2021-06-22 MED ORDER — ONDANSETRON 4 MG PO TBDP
4.0000 mg | ORAL_TABLET | Freq: Once | ORAL | Status: AC
  Administered 2021-06-22: 4 mg via ORAL
  Filled 2021-06-22: qty 1

## 2021-06-22 MED ORDER — HYDROCODONE-ACETAMINOPHEN 5-325 MG PO TABS
2.0000 | ORAL_TABLET | Freq: Once | ORAL | Status: AC
  Administered 2021-06-22: 2 via ORAL
  Filled 2021-06-22: qty 2
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
## 2021-06-22 NOTE — ED Notes (Signed)
Discharge instructions reviewed and explained, pt verbalized understanding and had no further questions. Pt caox4 and ambulatory at discharge.
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
## 2021-06-22 NOTE — Discharge Instructions (Signed)
If you develop new or worsening headache, neck pain, dizziness, vomiting, or any other new/concerning symptoms then return to the ER for evaluation.

## 2021-06-23 ENCOUNTER — Other Ambulatory Visit: Payer: Self-pay

## 2021-06-23 ENCOUNTER — Emergency Department (HOSPITAL_BASED_OUTPATIENT_CLINIC_OR_DEPARTMENT_OTHER)
Admission: EM | Admit: 2021-06-23 | Discharge: 2021-06-24 | Disposition: A | Discharge status: Home / Self Care | Payer: Medicaid Other | Attending: Emergency Medicine | Admitting: Emergency Medicine

## 2021-06-23 ENCOUNTER — Encounter (HOSPITAL_BASED_OUTPATIENT_CLINIC_OR_DEPARTMENT_OTHER): Payer: Self-pay

## 2021-06-23 DIAGNOSIS — M542 Cervicalgia: Secondary | ICD-10-CM | POA: Insufficient documentation

## 2021-06-23 DIAGNOSIS — R1084 Generalized abdominal pain: Secondary | ICD-10-CM | POA: Insufficient documentation

## 2021-06-23 DIAGNOSIS — R112 Nausea with vomiting, unspecified: Secondary | ICD-10-CM | POA: Diagnosis not present

## 2021-06-23 DIAGNOSIS — K5900 Constipation, unspecified: Secondary | ICD-10-CM | POA: Diagnosis not present

## 2021-06-23 DIAGNOSIS — I10 Essential (primary) hypertension: Secondary | ICD-10-CM | POA: Diagnosis not present

## 2021-06-23 DIAGNOSIS — R739 Hyperglycemia, unspecified: Secondary | ICD-10-CM

## 2021-06-23 DIAGNOSIS — M2550 Pain in unspecified joint: Secondary | ICD-10-CM | POA: Insufficient documentation

## 2021-06-23 DIAGNOSIS — R519 Headache, unspecified: Secondary | ICD-10-CM | POA: Insufficient documentation

## 2021-06-23 DIAGNOSIS — E1165 Type 2 diabetes mellitus with hyperglycemia: Secondary | ICD-10-CM | POA: Diagnosis not present

## 2021-06-23 DIAGNOSIS — M791 Myalgia, unspecified site: Secondary | ICD-10-CM | POA: Diagnosis not present
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
## 2021-06-24 ENCOUNTER — Encounter (HOSPITAL_BASED_OUTPATIENT_CLINIC_OR_DEPARTMENT_OTHER): Payer: Self-pay | Admitting: Radiology

## 2021-06-24 ENCOUNTER — Emergency Department (HOSPITAL_BASED_OUTPATIENT_CLINIC_OR_DEPARTMENT_OTHER): Payer: Medicaid Other

## 2021-06-24 LAB — COMPREHENSIVE METABOLIC PANEL
ALT: 17 U/L (ref 0–44)
AST: 12 U/L — ABNORMAL LOW (ref 15–41)
Albumin: 4 g/dL (ref 3.5–5.0)
Alkaline Phosphatase: 39 U/L (ref 38–126)
Anion gap: 12 (ref 5–15)
BUN: 15 mg/dL (ref 6–20)
CO2: 21 mmol/L — ABNORMAL LOW (ref 22–32)
Calcium: 9.2 mg/dL (ref 8.9–10.3)
Chloride: 104 mmol/L (ref 98–111)
Creatinine, Ser: 0.86 mg/dL (ref 0.61–1.24)
GFR, Estimated: >60 mL/min (ref >60)
Glucose, Bld: 179 mg/dL — ABNORMAL HIGH (ref 70–99)
Potassium: 3.6 mmol/L (ref 3.5–5.1)
Sodium: 137 mmol/L (ref 135–145)
Total Bilirubin: 0.8 mg/dL (ref 0.3–1.2)
Total Protein: 6.9 g/dL (ref 6.5–8.1)

## 2021-06-24 LAB — CBC
HCT: 48.9 % (ref 39.0–52.0)
Hemoglobin: 15.6 g/dL (ref 13.0–17.0)
MCH: 27.7 pg (ref 26.0–34.0)
MCHC: 31.9 g/dL (ref 30.0–36.0)
MCV: 86.9 fL (ref 80.0–100.0)
Platelets: 211 10*3/uL (ref 150–400)
RBC: 5.63 MIL/uL (ref 4.22–5.81)
RDW: 14 % (ref 11.5–15.5)
WBC: 5 10*3/uL (ref 4.0–10.5)
nRBC: 0 % (ref 0.0–0.2)

## 2021-06-24 LAB — TROPONIN I (HIGH SENSITIVITY): Troponin I (High Sensitivity): 3 ng/L (ref <18)

## 2021-06-24 IMAGING — DX DG CHEST 1V PORT
1 series · 1 of 1 positions shown · non-contrast
Comparison: Radiograph dated [DATE].

CLINICAL DATA: Chest pain.

EXAM:
PORTABLE CHEST 1 VIEW

[chest ap]
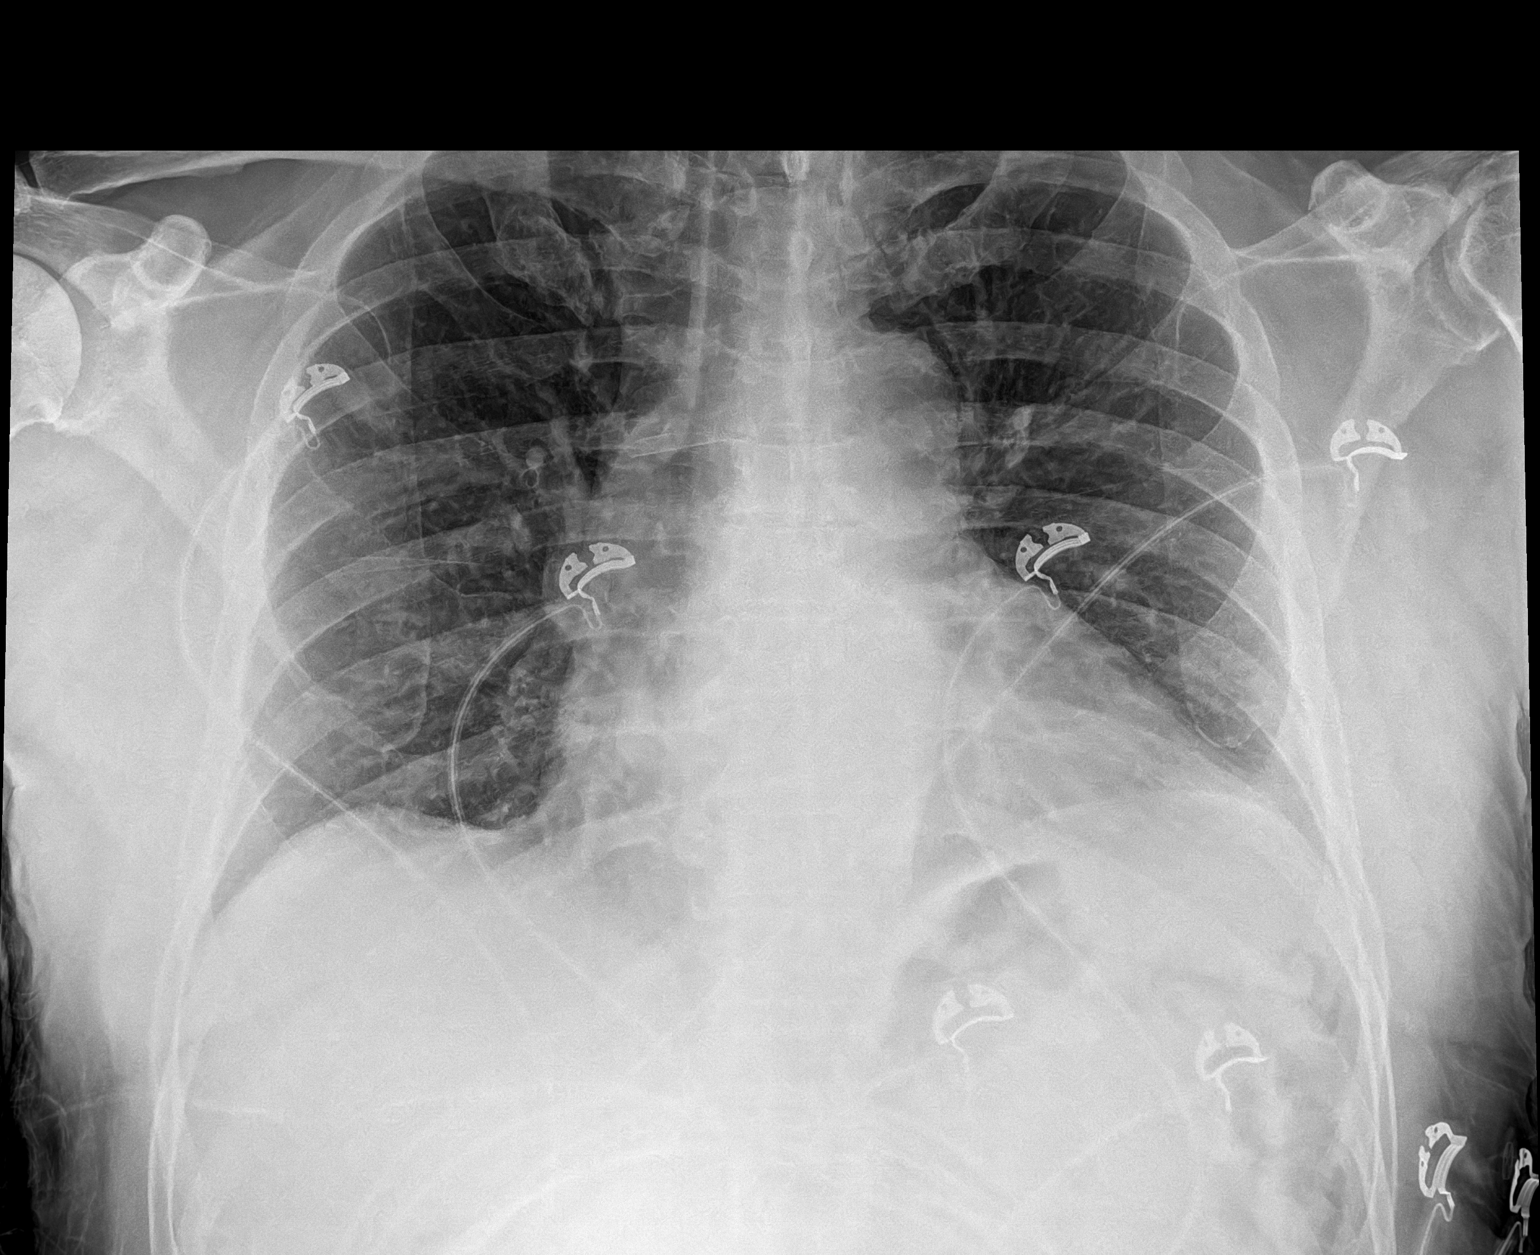

[1 of 1 positions shown; findings below may reference images not displayed]

FINDINGS: No focal consolidation, pleural effusion or pneumothorax. The
cardiac silhouette is within normal limits. No acute osseous
pathology.
IMPRESSION: No active disease.
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
## 2021-06-24 MED ORDER — SODIUM CHLORIDE 0.9 % IV BOLUS
1000.0000 mL | Freq: Once | INTRAVENOUS | Status: AC
Start: 2021-06-24 — End: 2021-06-24
  Administered 2021-06-24: 1000 mL via INTRAVENOUS

## 2021-06-24 MED ORDER — ONDANSETRON HCL 4 MG/2ML IJ SOLN
4.0000 mg | Freq: Once | INTRAMUSCULAR | Status: AC
  Administered 2021-06-24: 4 mg via INTRAVENOUS
  Filled 2021-06-24: qty 2

## 2021-06-24 MED ORDER — DIPHENHYDRAMINE HCL 50 MG/ML IJ SOLN
25.0000 mg | Freq: Once | INTRAMUSCULAR | Status: AC
  Administered 2021-06-24: 25 mg via INTRAVENOUS
  Filled 2021-06-24: qty 1

## 2021-06-24 MED ORDER — FENTANYL CITRATE PF 50 MCG/ML IJ SOSY
100.0000 ug | PREFILLED_SYRINGE | Freq: Once | INTRAMUSCULAR | Status: AC
  Administered 2021-06-24: 100 ug via INTRAVENOUS
  Filled 2021-06-24: qty 2

## 2021-06-24 MED ORDER — IOHEXOL 300 MG/ML  SOLN
100.0000 mL | Freq: Once | INTRAMUSCULAR | Status: AC | PRN
  Administered 2021-06-24: 100 mL via INTRAVENOUS

## 2021-06-24 NOTE — ED Notes (Signed)
Pt verbalizes understanding of discharge instructions. Opportunity for questioning and answers were provided. Pt discharged from ED to home.   ? ?

## 2021-06-24 NOTE — ED Notes (Signed)
Pt reports itching to face after receiving IV contrast. Pt resting quietly in bed. NAD. VSS.
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
## 2021-07-28 ENCOUNTER — Emergency Department (HOSPITAL_BASED_OUTPATIENT_CLINIC_OR_DEPARTMENT_OTHER): Payer: Medicaid Other

## 2021-07-28 ENCOUNTER — Other Ambulatory Visit (HOSPITAL_BASED_OUTPATIENT_CLINIC_OR_DEPARTMENT_OTHER): Payer: Self-pay

## 2021-07-28 ENCOUNTER — Other Ambulatory Visit: Payer: Self-pay

## 2021-07-28 ENCOUNTER — Emergency Department (HOSPITAL_BASED_OUTPATIENT_CLINIC_OR_DEPARTMENT_OTHER)
Admission: EM | Admit: 2021-07-28 | Discharge: 2021-07-28 | Disposition: A | Discharge status: Home / Self Care | Payer: Medicaid Other | Attending: Emergency Medicine | Admitting: Emergency Medicine

## 2021-07-28 ENCOUNTER — Encounter (HOSPITAL_BASED_OUTPATIENT_CLINIC_OR_DEPARTMENT_OTHER): Payer: Self-pay | Admitting: Obstetrics and Gynecology

## 2021-07-28 DIAGNOSIS — R112 Nausea with vomiting, unspecified: Secondary | ICD-10-CM | POA: Diagnosis not present

## 2021-07-28 DIAGNOSIS — M5137 Other intervertebral disc degeneration, lumbosacral region: Secondary | ICD-10-CM | POA: Insufficient documentation

## 2021-07-28 DIAGNOSIS — I10 Essential (primary) hypertension: Secondary | ICD-10-CM | POA: Diagnosis not present

## 2021-07-28 DIAGNOSIS — M545 Low back pain, unspecified: Secondary | ICD-10-CM | POA: Diagnosis present

## 2021-07-28 DIAGNOSIS — E119 Type 2 diabetes mellitus without complications: Secondary | ICD-10-CM | POA: Insufficient documentation

## 2021-07-28 DIAGNOSIS — Z79899 Other long term (current) drug therapy: Secondary | ICD-10-CM | POA: Insufficient documentation

## 2021-07-28 DIAGNOSIS — Z7984 Long term (current) use of oral hypoglycemic drugs: Secondary | ICD-10-CM | POA: Insufficient documentation

## 2021-07-28 LAB — CBC
HCT: 49.9 % (ref 39.0–52.0)
Hemoglobin: 16.1 g/dL (ref 13.0–17.0)
MCH: 27.9 pg (ref 26.0–34.0)
MCHC: 32.3 g/dL (ref 30.0–36.0)
MCV: 86.5 fL (ref 80.0–100.0)
Platelets: 211 10*3/uL (ref 150–400)
RBC: 5.77 MIL/uL (ref 4.22–5.81)
RDW: 14.4 % (ref 11.5–15.5)
WBC: 4.5 10*3/uL (ref 4.0–10.5)
nRBC: 0 % (ref 0.0–0.2)

## 2021-07-28 LAB — LIPASE, BLOOD: Lipase: 75 U/L — ABNORMAL HIGH (ref 11–51)

## 2021-07-28 LAB — COMPREHENSIVE METABOLIC PANEL
ALT: 20 U/L (ref 0–44)
AST: 17 U/L (ref 15–41)
Albumin: 4.2 g/dL (ref 3.5–5.0)
Alkaline Phosphatase: 42 U/L (ref 38–126)
Anion gap: 18 — ABNORMAL HIGH (ref 5–15)
BUN: 16 mg/dL (ref 6–20)
CO2: 21 mmol/L — ABNORMAL LOW (ref 22–32)
Calcium: 9.4 mg/dL (ref 8.9–10.3)
Chloride: 99 mmol/L (ref 98–111)
Creatinine, Ser: 1.01 mg/dL (ref 0.61–1.24)
GFR, Estimated: >60 mL/min (ref >60)
Glucose, Bld: 257 mg/dL — ABNORMAL HIGH (ref 70–99)
Potassium: 3.4 mmol/L — ABNORMAL LOW (ref 3.5–5.1)
Sodium: 138 mmol/L (ref 135–145)
Total Bilirubin: 1.4 mg/dL — ABNORMAL HIGH (ref 0.3–1.2)
Total Protein: 7 g/dL (ref 6.5–8.1)

## 2021-07-28 MED ORDER — ONDANSETRON HCL 4 MG PO TABS
4.0000 mg | ORAL_TABLET | Freq: Four times a day (QID) | ORAL | 0 refills | Status: DC
  Filled 2021-07-28: qty 20, 5d supply, fill #0

## 2021-07-28 MED ORDER — ONDANSETRON 4 MG PO TBDP
4.0000 mg | ORAL_TABLET | Freq: Once | ORAL | Status: AC | PRN
Start: 2021-07-28 — End: 2021-07-28
  Administered 2021-07-28: 4 mg via ORAL
  Filled 2021-07-28: qty 1

## 2021-07-28 MED ORDER — MORPHINE SULFATE (PF) 4 MG/ML IV SOLN
4.0000 mg | Freq: Once | INTRAVENOUS | Status: AC
  Administered 2021-07-28: 4 mg via INTRAVENOUS
  Filled 2021-07-28: qty 1

## 2021-07-28 MED ORDER — OXYCODONE-ACETAMINOPHEN 5-325 MG PO TABS
1.0000 | ORAL_TABLET | ORAL | Status: DC | PRN
  Administered 2021-07-28: 1 via ORAL
  Filled 2021-07-28: qty 1

## 2021-07-28 MED ORDER — ONDANSETRON HCL 4 MG/2ML IJ SOLN
4.0000 mg | Freq: Once | INTRAMUSCULAR | Status: AC
  Administered 2021-07-28: 4 mg via INTRAVENOUS
  Filled 2021-07-28: qty 2

## 2021-07-28 MED ORDER — LACTATED RINGERS IV BOLUS
1000.0000 mL | Freq: Once | INTRAVENOUS | Status: AC
  Administered 2021-07-28: 1000 mL via INTRAVENOUS
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
## 2021-08-07 ENCOUNTER — Other Ambulatory Visit (HOSPITAL_BASED_OUTPATIENT_CLINIC_OR_DEPARTMENT_OTHER): Payer: Self-pay

## 2021-08-22 ENCOUNTER — Other Ambulatory Visit: Payer: Self-pay | Admitting: Internal Medicine

## 2021-08-23 LAB — CBC
HCT: 49.6 % (ref 38.5–50.0)
Hemoglobin: 16.1 g/dL (ref 13.2–17.1)
MCH: 28.2 pg (ref 27.0–33.0)
MCHC: 32.5 g/dL (ref 32.0–36.0)
MCV: 86.9 fL (ref 80.0–100.0)
MPV: 10.2 fL (ref 7.5–12.5)
Platelets: 218 10*3/uL (ref 140–400)
RBC: 5.71 10*6/uL (ref 4.20–5.80)
RDW: 12.9 % (ref 11.0–15.0)
WBC: 3.9 10*3/uL (ref 3.8–10.8)

## 2021-08-23 LAB — COMPLETE METABOLIC PANEL WITH GFR
AG Ratio: 1.6 (calc) (ref 1.0–2.5)
ALT: 16 U/L (ref 9–46)
AST: 15 U/L (ref 10–35)
Albumin: 4.1 g/dL (ref 3.6–5.1)
Alkaline phosphatase (APISO): 43 U/L (ref 35–144)
BUN: 15 mg/dL (ref 7–25)
CO2: 21 mmol/L (ref 20–32)
Calcium: 9.2 mg/dL (ref 8.6–10.3)
Chloride: 100 mmol/L (ref 98–110)
Creat: 1.14 mg/dL (ref 0.70–1.30)
Globulin: 2.5 g/dL (calc) (ref 1.9–3.7)
Glucose, Bld: 194 mg/dL — ABNORMAL HIGH (ref 65–99)
Potassium: 4.3 mmol/L (ref 3.5–5.3)
Sodium: 136 mmol/L (ref 135–146)
Total Bilirubin: 0.7 mg/dL (ref 0.2–1.2)
Total Protein: 6.6 g/dL (ref 6.1–8.1)
eGFR: 76 mL/min/{1.73_m2} (ref >=60)

## 2021-08-23 LAB — LIPID PANEL
Cholesterol: 243 mg/dL — ABNORMAL HIGH (ref <200)
HDL: 71 mg/dL (ref >=40)
LDL Cholesterol (Calc): 146 mg/dL (calc) — ABNORMAL HIGH
Non-HDL Cholesterol (Calc): 172 mg/dL (calc) — ABNORMAL HIGH (ref <130)
Total CHOL/HDL Ratio: 3.4 (calc) (ref <5.0)
Triglycerides: 133 mg/dL (ref <150)

## 2021-08-23 LAB — TSH: TSH: 0.63 mIU/L (ref 0.40–4.50)

## 2021-08-23 LAB — VITAMIN D 25 HYDROXY (VIT D DEFICIENCY, FRACTURES): Vit D, 25-Hydroxy: 54 ng/mL (ref 30–100)

## 2021-08-23 LAB — PSA: PSA: 0.81 ng/mL (ref <=4.00)

## 2022-04-28 ENCOUNTER — Encounter (INDEPENDENT_AMBULATORY_CARE_PROVIDER_SITE_OTHER): Payer: Medicaid Other | Admitting: Ophthalmology

## 2022-07-14 ENCOUNTER — Other Ambulatory Visit (HOSPITAL_BASED_OUTPATIENT_CLINIC_OR_DEPARTMENT_OTHER): Payer: Self-pay

## 2022-07-29 ENCOUNTER — Other Ambulatory Visit: Payer: Self-pay | Admitting: Internal Medicine

## 2022-07-30 LAB — COMPLETE METABOLIC PANEL WITH GFR
AG Ratio: 1.5 (calc) (ref 1.0–2.5)
ALT: 17 U/L (ref 9–46)
AST: 15 U/L (ref 10–35)
Albumin: 4.3 g/dL (ref 3.6–5.1)
Alkaline phosphatase (APISO): 45 U/L (ref 35–144)
BUN: 15 mg/dL (ref 7–25)
CO2: 20 mmol/L (ref 20–32)
Calcium: 9.8 mg/dL (ref 8.6–10.3)
Chloride: 102 mmol/L (ref 98–110)
Creat: 1.02 mg/dL (ref 0.70–1.30)
Globulin: 2.9 g/dL (calc) (ref 1.9–3.7)
Glucose, Bld: 137 mg/dL — ABNORMAL HIGH (ref 65–99)
Potassium: 4.4 mmol/L (ref 3.5–5.3)
Sodium: 137 mmol/L (ref 135–146)
Total Bilirubin: 0.6 mg/dL (ref 0.2–1.2)
Total Protein: 7.2 g/dL (ref 6.1–8.1)
eGFR: 87 mL/min/{1.73_m2} (ref >=60)

## 2022-07-30 LAB — CBC
HCT: 50.6 % — ABNORMAL HIGH (ref 38.5–50.0)
Hemoglobin: 16.4 g/dL (ref 13.2–17.1)
MCH: 28.7 pg (ref 27.0–33.0)
MCHC: 32.4 g/dL (ref 32.0–36.0)
MCV: 88.5 fL (ref 80.0–100.0)
MPV: 10.5 fL (ref 7.5–12.5)
Platelets: 217 10*3/uL (ref 140–400)
RBC: 5.72 10*6/uL (ref 4.20–5.80)
RDW: 12.4 % (ref 11.0–15.0)
WBC: 5.5 10*3/uL (ref 3.8–10.8)

## 2022-07-30 LAB — TSH: TSH: 0.73 mIU/L (ref 0.40–4.50)

## 2022-07-30 LAB — LIPID PANEL
Cholesterol: 230 mg/dL — ABNORMAL HIGH (ref <200)
HDL: 75 mg/dL (ref >=40)
LDL Cholesterol (Calc): 120 mg/dL (calc) — ABNORMAL HIGH
Non-HDL Cholesterol (Calc): 155 mg/dL (calc) — ABNORMAL HIGH (ref <130)
Total CHOL/HDL Ratio: 3.1 (calc) (ref <5.0)
Triglycerides: 248 mg/dL — ABNORMAL HIGH (ref <150)

## 2022-07-30 LAB — VITAMIN D 25 HYDROXY (VIT D DEFICIENCY, FRACTURES): Vit D, 25-Hydroxy: 55 ng/mL (ref 30–100)

## 2022-07-30 LAB — PSA: PSA: 1.14 ng/mL (ref <=4.00)

## 2022-10-29 NOTE — Progress Notes (Signed)
[facility] - Clinic Note  11/10/2022   CHIEF COMPLAINT Patient presents for Retina Evaluation  HISTORY OF PRESENT ILLNESS: Robert Hooper is a 57 y.o. male who presents to the clinic today for:  HPI     Retina Evaluation   In both eyes.  This started 13 years ago.  Associated Symptoms Negative for Flashes and Floaters.        Comments   Patient is a former patient of Dr. Luciana Axe. He is being seen for a diabetic exam. His blood sugar was 113. He uses Visine PRN. He states the eye water often.       Last edited by Charlette Caffey, COT on 11/10/2022  1:40 PM.     Patient states that he was diagnosed about 13 years ago.   Referring physician: [name], MD [address] [place],  [place] [address]  HISTORICAL INFORMATION:  Selected notes from the MEDICAL RECORD NUMBER Referred by Dr. Nedra Hai:  Ocular Hx- PMH-   CURRENT MEDICATIONS: No current outpatient medications on file. (Ophthalmic Drugs)   No current facility-administered medications for this visit. (Ophthalmic Drugs)   Current Outpatient Medications (Other)  Medication Sig   ALPRAZolam (XANAX) 1 MG tablet Take 1 mg by mouth 2 (two) times daily as needed for anxiety.   amphetamine-dextroamphetamine (ADDERALL) 30 MG tablet Take 30 mg by mouth 2 (two) times daily as needed (to focus).   CALCIUM PO Take 1 tablet by mouth See admin instructions. Calcium gummies - take one tablet by mouth daily for one month, skip a month and then resume taking   cyclobenzaprine (FLEXERIL) 10 MG tablet Take 1 tablet (10 mg total) by mouth 2 (two) times daily as needed for muscle spasms.   dapagliflozin propanediol (FARXIGA) 10 MG TABS tablet Take 10 mg by mouth every morning.   Dulaglutide (TRULICITY) 0.75 MG/0.5ML SOPN Inject 0.75 mg into the skin every Saturday.   hydrocortisone 2.5 % cream Apply 1 application topically every 6 (six) hours as needed (hemorrhoids).   lisinopril (PRINIVIL,ZESTRIL) 10 MG tablet  Take 10 mg by mouth every morning.   metFORMIN (GLUCOPHAGE) 1000 MG tablet Take 1,000 mg by mouth 2 (two) times daily.   Multiple Vitamins-Minerals (ADULT ONE DAILY GUMMIES) CHEW Chew 1 tablet by mouth See admin instructions. take one tablet by mouth daily for one month, skip a month and then resume taking   mycophenolate (CELLCEPT) 500 MG tablet Take 500-1,000 mg by mouth See admin instructions. Take one tablet (500 mg) by mouth every morning and two tablets (1000 mg) at night   naproxen (NAPROSYN) 375 MG tablet Take 1 tablet (375 mg total) by mouth 2 (two) times daily.   omeprazole (PRILOSEC) 20 MG capsule Take 20 mg by mouth daily as needed (acid reflux/indigestion).   ondansetron (ZOFRAN) 4 MG tablet Take 1 tablet (4 mg total) by mouth every 6 (six) hours.   oxyCODONE (ROXICODONE) 15 MG immediate release tablet Take 15 mg by mouth 5 (five) times daily as needed for pain.   predniSONE (DELTASONE) 10 MG tablet Take 10 mg by mouth every morning.   QUEtiapine (SEROQUEL) 400 MG tablet Take 400 mg by mouth at bedtime.   No current facility-administered medications for this visit. (Other)   REVIEW OF SYSTEMS: ROS   Positive for: Musculoskeletal, Endocrine, Eyes, Psychiatric Last edited by Charlette Caffey, COT on 11/10/2022  1:40 PM.     ALLERGIES Allergies  Allergen Reactions   Lactose Intolerance (Gi) Nausea And Vomiting  Penicillins Other (See Comments)    Unknown childhood reaction   Iodinated Contrast Media Itching   PAST MEDICAL HISTORY Past Medical History:  Diagnosis Date   Arthritis    Back pain    chronic   Bipolar 1 disorder (HCC)    Depression    Diabetes mellitus without complication (HCC)    Hemorrhoids    Hypertension    History reviewed. No pertinent surgical history. FAMILY HISTORY History reviewed. No pertinent family history. SOCIAL HISTORY Social History   Tobacco Use   Smoking status: Never    Passive exposure: Never   Smokeless tobacco: Never   Vaping Use   Vaping status: Never Used  Substance Use Topics   Alcohol use: No   Drug use: No       OPHTHALMIC EXAM:  Base Eye Exam     Visual Acuity (Snellen - Linear)       Right Left   Dist [place] 20/20 20/25   Dist ph [place]  20/20 +2         Tonometry (Tonopen, 1:47 PM)       Right Left   Pressure 21 20         Pupils       Dark Light Shape React APD   Right 6 5 Round Slow None   Left 6 5 Round Slow None         Visual Fields       Left Right    Full Full         Extraocular Movement       Right Left    Full, Ortho Full, Ortho         Neuro/Psych     Oriented x3: Yes   Mood/Affect: Normal         Dilation     Both eyes: 1.0% Mydriacyl, 2.5% Phenylephrine @ 1:48 PM           Slit Lamp and Fundus Exam     External Exam       Right Left   External Normal Normal         Slit Lamp Exam       Right Left   Lids/Lashes Normal Normal   Conjunctiva/Sclera Mild Melanosis Mild Melanosis   Cornea Arcus Arcus   Anterior Chamber Deep and clear Deep and clear   Iris Round and dilated, No NVI Round and dilated, No NVI   Lens 2+ Nuclear sclerosis, 2+ Cortical cataract 2+ Nuclear sclerosis, 2+ Cortical cataract   Anterior Vitreous vitreous synerisis vitreous synerisis         Fundus Exam       Right Left   Posterior Vitreous Normal Normal   Disc Pink and sharp, + cupping Pink and sharp, cupping   C/D Ratio 0.7 0.7   Macula Flat, Good foveal reflex, No heme or edema Flat, Good foveal reflex, No heme or edema   Vessels Tortuous, Vascular attenuation Tortuous, Vascular attenuation   Periphery Attached, No heme Attached, No heme           Refraction     Manifest Refraction       Sphere Cylinder Axis Dist VA   Right       Left -0.75 +0.75 015 20/20           IMAGING AND PROCEDURES  Imaging and Procedures for 11/10/2022  OCT, Retina - OU - Both Eyes       Right Eye Quality was good. Scan  locations included  subfoveal. Central Foveal Thickness: 238. Progression has been stable. Findings include normal foveal contour, no IRF, no SRF, vitreomacular adhesion .   Left Eye Quality was good. Scan locations included subfoveal. Central Foveal Thickness: 240. Progression has been stable. Findings include normal foveal contour, no IRF, no SRF, vitreomacular adhesion .   Notes *Images captured and stored on drive  Diagnosis / Impression:  OD: no DME OS: no DME  Clinical management:  See below  Abbreviations: NFP - Normal foveal profile. CME - cystoid macular edema. PED - pigment epithelial detachment. IRF - intraretinal fluid. SRF - subretinal fluid. EZ - ellipsoid zone. ERM - epiretinal membrane. ORA - outer retinal atrophy. ORT - outer retinal tubulation. SRHM - subretinal hyper-reflective material. IRHM - intraretinal hyper-reflective material           ASSESSMENT/PLAN:   ICD-10-CM   1. Diabetes mellitus without complication (HCC)  E11.9 OCT, Retina - OU - Both Eyes    2. Diabetes mellitus treated with insulin and oral medication (HCC)  E11.9    Z79.4    Z79.84     3. Diabetes mellitus treated with injections of non-insulin medication (HCC)  E11.9    Z79.85     4. Hypertensive retinopathy of both eyes  H35.033     5. Essential hypertension  I10     6. 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 Nuclear sclerotic cataract of left eye  H25.12      1-3. Diabetes mellitus, type 2 without retinopathy  - A1C 7.5 (09.08.24)  - taking Prednisone 10 mg PO QD - The incidence, risk factors for progression, natural history and treatment options for diabetic retinopathy  were discussed with patient.   - The need for close monitoring of blood glucose, blood pressure, and serum lipids, avoiding cigarette or any type of tobacco, and the need for long term follow up was also discussed with patient. - OCT shows no DME OU - f/u in 1 year, sooner prn, DFE, OCT  4,5. Hypertensive retinopathy  OU - discussed importance of tight BP control - monitor    6.  Vitreomacular adhesions, OU  - monitor  7. Mixed Cataract OU - The symptoms of cataract, surgical options, and treatments and risks were discussed with patient. - discussed diagnosis and progression - monitor    Ophthalmic Meds Ordered this visit:  No orders of the defined types were placed in this encounter.    No follow-ups on file.  There are no Patient Instructions on file for this visit.  Explained the diagnoses, plan, and follow up with the patient and they expressed understanding.  Patient expressed understanding of the importance of proper follow up care.   This document serves as a record of services personally performed by Karie Chimera, MD, PhD. It was created on their behalf by Charlette Caffey, COT an ophthalmic technician. The creation of this record is the provider's dictation and/or activities during the visit.    Electronically signed by:  Charlette Caffey, COT  11/10/22 3:11 PM   Karie Chimera, M.D., Ph.D. Diseases & Surgery of the Retina and Vitreous [facility] 11/10/2022  Abbreviations: M myopia (nearsighted); A astigmatism; H hyperopia (farsighted); P presbyopia; Mrx spectacle prescription;  CTL contact lenses; OD right eye; OS left eye; OU both eyes  XT exotropia; ET esotropia; PEK punctate epithelial keratitis; PEE punctate epithelial erosions; DES dry eye syndrome; MGD meibomian gland dysfunction;  ATs artificial tears; PFAT's preservative free artificial tears; NSC nuclear sclerotic cataract; PSC posterior subcapsular cataract; ERM epi-retinal membrane; PVD posterior vitreous detachment; RD retinal detachment; DM diabetes mellitus; DR diabetic retinopathy; NPDR non-proliferative diabetic retinopathy; PDR proliferative diabetic retinopathy; CSME clinically significant macular edema; DME diabetic macular edema; dbh dot blot hemorrhages; CWS cotton wool spot; POAG  primary open angle glaucoma; C/D cup-to-disc ratio; HVF humphrey visual field; GVF goldmann visual field; OCT optical coherence tomography; IOP intraocular pressure; BRVO Branch retinal vein occlusion; CRVO central retinal vein occlusion; CRAO central retinal artery occlusion; BRAO branch retinal artery occlusion; RT retinal tear; SB scleral buckle; PPV pars plana vitrectomy; VH Vitreous hemorrhage; PRP panretinal laser photocoagulation; IVK intravitreal kenalog; VMT vitreomacular traction; MH Macular hole;  NVD neovascularization of the disc; NVE neovascularization elsewhere; AREDS age related eye disease study; ARMD age related macular degeneration; POAG primary open angle glaucoma; EBMD epithelial/anterior basement membrane dystrophy; ACIOL anterior chamber intraocular lens; IOL intraocular lens; PCIOL posterior chamber intraocular lens; Phaco/IOL phacoemulsification with intraocular lens placement; PRK photorefractive keratectomy; LASIK laser assisted in situ keratomileusis; HTN hypertension; DM diabetes mellitus; COPD chronic obstructive pulmonary disease

## 2022-11-10 ENCOUNTER — Encounter (INDEPENDENT_AMBULATORY_CARE_PROVIDER_SITE_OTHER): Payer: Self-pay | Admitting: Ophthalmology

## 2022-11-10 ENCOUNTER — Ambulatory Visit (INDEPENDENT_AMBULATORY_CARE_PROVIDER_SITE_OTHER): Payer: MEDICAID | Admitting: Ophthalmology

## 2022-11-10 DIAGNOSIS — H35033 Hypertensive retinopathy, bilateral: Secondary | ICD-10-CM | POA: Diagnosis not present

## 2022-11-10 DIAGNOSIS — H2512 Age-related nuclear cataract, left eye: Secondary | ICD-10-CM

## 2022-11-10 DIAGNOSIS — I10 Essential (primary) hypertension: Secondary | ICD-10-CM

## 2022-11-10 DIAGNOSIS — H25813 Combined forms of age-related cataract, bilateral: Secondary | ICD-10-CM

## 2022-11-10 DIAGNOSIS — H43823 Vitreomacular adhesion, bilateral: Secondary | ICD-10-CM

## 2022-11-10 DIAGNOSIS — Z7985 Long-term (current) use of injectable non-insulin antidiabetic drugs: Secondary | ICD-10-CM

## 2022-11-10 DIAGNOSIS — Z794 Long term (current) use of insulin: Secondary | ICD-10-CM

## 2022-11-10 DIAGNOSIS — Z7984 Long term (current) use of oral hypoglycemic drugs: Secondary | ICD-10-CM

## 2022-11-10 DIAGNOSIS — E119 Type 2 diabetes mellitus without complications: Secondary | ICD-10-CM

## 2022-11-12 ENCOUNTER — Encounter (INDEPENDENT_AMBULATORY_CARE_PROVIDER_SITE_OTHER): Payer: Self-pay | Admitting: Ophthalmology

## 2023-05-03 ENCOUNTER — Ambulatory Visit: Payer: Self-pay | Admitting: Surgery

## 2023-05-13 NOTE — Progress Notes (Addendum)
 Anesthesia Review:  PCP: Charle Congo  Cardiologist : none   PPM/ ICD: Device Orders: Rep Notified:  Chest x-ray : EKG : 05/17/2023  Echo : Stress test: Cardiac Cath :   Activity level: can do a flight of stairs without difficutoy  Sleep Study/ CPAP : none  Fasting Blood Sugar :      / Checks Blood Sugar -- times a day:     DM- type2-checks glucose am and pm  Hgba1c- 05/17/2023  - 7.9 - routed to DR Stechschulte on 05/18/23.  Elvina Hammers- hodl for 72 hours prior to procedure- alst dose on 05/21/2023  Dulaglutide on Fridays- last dose on 05/14/2023  Amaryl- none am of surgeyr  Metformin-none am of surgery   Blood Thinner/ Instructions /Last Dose: ASA / Instructions/ Last Dose :

## 2023-05-13 NOTE — Patient Instructions (Signed)
 SURGICAL WAITING ROOM VISITATION  Patients having surgery or a procedure may have no more than 2 support people in the waiting area - these visitors may rotate.    Children under the age of 12 must have an adult with them who is not the patient.  Due to an increase in RSV and influenza rates and associated hospitalizations, children ages 71 and under may not visit patients in [facility] hospitals.  Visitors with respiratory illnesses are discouraged from visiting and should remain at home.  If the patient needs to stay at the hospital during part of their recovery, the visitor guidelines for inpatient rooms apply. Pre-op nurse will coordinate an appropriate time for 1 support person to accompany patient in pre-op.  This support person may not rotate.    Please refer to the [facility] website for the visitor guidelines for Inpatients (after your surgery is over and you are in a regular room).       Your procedure is scheduled on: 05/25/2023    Report to [facility] Main Entrance    Report to admitting at  [phone]   Call this number if you have problems the morning of surgery [phone]   Do not eat food :After Midnight.   After Midnight you may have the following liquids until __0430____ AM DAY OF SURGERY  Water Non-Citrus Juices (without pulp, NO RED-Apple, White grape, White cranberry) Black Coffee (NO MILK/CREAM OR CREAMERS, sugar ok)  Clear Tea (NO MILK/CREAM OR CREAMERS, sugar ok) regular and decaf                             Plain Jell-O (NO RED)                                           Fruit ices (not with fruit pulp, NO RED)                                     Popsicles (NO RED)                                                               Sports drinks like Gatorade (NO RED)                    The day of surgery:  Drink ONE (1) Pre-Surgery Clear Ensure or G2 at    0430AM  ( have completed by ) the morning of surgery. Drink in one sitting. Do not sip.   This drink was given to you during your hospital  pre-op appointment visit. Nothing else to drink after completing the  Pre-Surgery Clear Ensure or G2.          If you have questions, please contact your surgeon's office.       Oral Hygiene is also important to reduce your risk of infection.  Remember - BRUSH YOUR TEETH THE MORNING OF SURGERY WITH YOUR REGULAR TOOTHPASTE  DENTURES WILL BE REMOVED PRIOR TO SURGERY PLEASE DO NOT APPLY "Poly grip" OR ADHESIVES!!!   Do NOT smoke after Midnight   Stop all vitamins and herbal supplements 7 days before surgery.   Take these medicines the morning of surgery with A SIP OF WATER:  cellcept, prednisone               Metformin- none day of surgery             Farxiga- hold for 72 hours prior to surgery             Amaryl- none am of surgery             Dulaglutide- last dose on   DO NOT TAKE ANY ORAL DIABETIC MEDICATIONS DAY OF YOUR SURGERY  Bring CPAP mask and tubing day of surgery.                              You may not have any metal on your body including hair pins, jewelry, and body piercing             Do not wear make-up, lotions, powders, perfumes/cologne, or deodorant  Do not wear nail polish including gel and S&S, artificial/acrylic nails, or any other type of covering on natural nails including finger and toenails. If you have artificial nails, gel coating, etc. that needs to be removed by a nail salon please have this removed prior to surgery or surgery may need to be canceled/ delayed if the surgeon/ anesthesia feels like they are unable to be safely monitored.   Do not shave  48 hours prior to surgery.               Men may shave face and neck.   Do not bring valuables to the hospital. [facility] IS NOT             RESPONSIBLE   FOR VALUABLES.   Contacts, glasses, dentures or bridgework may not be worn into surgery.   Bring small overnight bag day of surgery.   DO NOT BRING YOUR  HOME MEDICATIONS TO THE HOSPITAL. PHARMACY WILL DISPENSE MEDICATIONS LISTED ON YOUR MEDICATION LIST TO YOU DURING YOUR ADMISSION IN THE HOSPITAL!    Patients discharged on the day of surgery will not be allowed to drive home.  Someone NEEDS to stay with you for the first 24 hours after anesthesia.   Special Instructions: Bring a copy of your healthcare power of attorney and living will documents the day of surgery if you haven't scanned them before.              Please read over the following fact sheets you were given: IF YOU HAVE QUESTIONS ABOUT YOUR PRE-OP INSTRUCTIONS PLEASE CALL [phone]   If you received a COVID test during your pre-op visit  it is requested that you wear a mask when out in public, stay away from anyone that may not be feeling well and notify your surgeon if you develop symptoms. If you test positive for Covid or have been in contact with anyone that has tested positive in the last 10 days please notify you surgeon.    [facility] - Preparing for Surgery Before surgery, you can play an important role.  Because skin is not sterile, your skin needs to be as free of germs  as possible.  You can reduce the number of germs on your skin by washing with CHG (chlorahexidine gluconate) soap before surgery.  CHG is an antiseptic cleaner which kills germs and bonds with the skin to continue killing germs even after washing. Please DO NOT use if you have an allergy to CHG or antibacterial soaps.  If your skin becomes reddened/irritated stop using the CHG and inform your nurse when you arrive at Short Stay. Do not shave (including legs and underarms) for at least 48 hours prior to the first CHG shower.  You may shave your face/neck. Please follow these instructions carefully:  1.  Shower with CHG Soap the night before surgery and the  morning of Surgery.  2.  If you choose to wash your hair, wash your hair first as usual with your  normal  shampoo.  3.  After you shampoo, rinse your  hair and body thoroughly to remove the  shampoo.                           4.  Use CHG as you would any other liquid soap.  You can apply chg directly  to the skin and wash                       Gently with a scrungie or clean washcloth.  5.  Apply the CHG Soap to your body ONLY FROM THE NECK DOWN.   Do not use on face/ open                           Wound or open sores. Avoid contact with eyes, ears mouth and genitals (private parts).                       Wash face,  Genitals (private parts) with your normal soap.             6.  Wash thoroughly, paying special attention to the area where your surgery  will be performed.  7.  Thoroughly rinse your body with warm water from the neck down.  8.  DO NOT shower/wash with your normal soap after using and rinsing off  the CHG Soap.                9.  Pat yourself dry with a clean towel.            10.  Wear clean pajamas.            11.  Place clean sheets on your bed the night of your first shower and do not  sleep with pets. Day of Surgery : Do not apply any lotions/deodorants the morning of surgery.  Please wear clean clothes to the hospital/surgery center.  FAILURE TO FOLLOW THESE INSTRUCTIONS MAY RESULT IN THE CANCELLATION OF YOUR SURGERY PATIENT SIGNATURE_________________________________  NURSE SIGNATURE__________________________________  ________________________________________________________________________

## 2023-05-17 ENCOUNTER — Encounter (HOSPITAL_COMMUNITY)
Admission: RE | Admit: 2023-05-17 | Discharge: 2023-05-17 | Disposition: A | Discharge status: Home / Self Care | Payer: MEDICAID | Source: Ambulatory Visit | Attending: Surgery | Admitting: Surgery

## 2023-05-17 ENCOUNTER — Other Ambulatory Visit: Payer: Self-pay

## 2023-05-17 ENCOUNTER — Encounter (HOSPITAL_COMMUNITY): Payer: Self-pay

## 2023-05-17 VITALS — BP 148/98 | HR 91 | Temp 98.1°F | Resp 16 | Ht 68.0 in | Wt 200.0 lb

## 2023-05-17 DIAGNOSIS — Z01818 Encounter for other preprocedural examination: Secondary | ICD-10-CM | POA: Insufficient documentation

## 2023-05-17 HISTORY — DX: Pemphigus vulgaris: L10.0

## 2023-05-17 LAB — BASIC METABOLIC PANEL WITH GFR
Anion gap: 10 (ref 5–15)
BUN: 17 mg/dL (ref 6–20)
CO2: 22 mmol/L (ref 22–32)
Calcium: 9.2 mg/dL (ref 8.9–10.3)
Chloride: 103 mmol/L (ref 98–111)
Creatinine, Ser: 1.17 mg/dL (ref 0.61–1.24)
GFR, Estimated: >60 mL/min (ref >60)
Glucose, Bld: 170 mg/dL — ABNORMAL HIGH (ref 70–99)
Potassium: 4 mmol/L (ref 3.5–5.1)
Sodium: 135 mmol/L (ref 135–145)

## 2023-05-17 LAB — CBC
HCT: 51.1 % (ref 39.0–52.0)
Hemoglobin: 15.9 g/dL (ref 13.0–17.0)
MCH: 28.3 pg (ref 26.0–34.0)
MCHC: 31.1 g/dL (ref 30.0–36.0)
MCV: 91.1 fL (ref 80.0–100.0)
Platelets: 233 10*3/uL (ref 150–400)
RBC: 5.61 MIL/uL (ref 4.22–5.81)
RDW: 13.1 % (ref 11.5–15.5)
WBC: 5.4 10*3/uL (ref 4.0–10.5)
nRBC: 0 % (ref 0.0–0.2)

## 2023-05-17 LAB — GLUCOSE, CAPILLARY: Glucose-Capillary: 173 mg/dL — ABNORMAL HIGH (ref 70–99)

## 2023-05-18 LAB — HEMOGLOBIN A1C
Hgb A1c MFr Bld: 7.9 % — ABNORMAL HIGH (ref 4.8–5.6)
Mean Plasma Glucose: 180 mg/dL

## 2023-05-24 NOTE — Anesthesia Preprocedure Evaluation (Addendum)
 Anesthesia Evaluation  Patient identified by MRN, date of birth, ID band Patient awake    Reviewed: Allergy & Precautions, NPO status , Patient's Chart, lab work & pertinent test results  History of Anesthesia Complications Negative for: history of anesthetic complications  Airway Mallampati: II  TM Distance: >3 FB Neck ROM: Full    Dental  (+) Edentulous Upper, Edentulous Lower   Pulmonary neg pulmonary ROS   Pulmonary exam normal        Cardiovascular hypertension, Pt. on medications Normal cardiovascular exam     Neuro/Psych     Bipolar Disorder      GI/Hepatic Neg liver ROS,,,right inguinal hernia   Endo/Other  diabetes, Type 2, Oral Hypoglycemic Agents    Renal/GU negative Renal ROS  negative genitourinary   Musculoskeletal  (+) Arthritis ,    Abdominal   Peds  Hematology  (+) REFUSES BLOOD PRODUCTS  Anesthesia Other Findings Day of surgery medications reviewed with patient.  Reproductive/Obstetrics negative OB ROS                              Anesthesia Physical Anesthesia Plan  ASA: 2  Anesthesia Plan: General   Post-op Pain Management: Tylenol  PO (pre-op)*   Induction: Intravenous  PONV Risk Score and Plan: 3 and Treatment may vary due to age or medical condition, Midazolam , Dexamethasone  and Ondansetron   Airway Management Planned: Oral ETT  Additional Equipment:   Intra-op Plan:   Post-operative Plan: Extubation in OR  Informed Consent: I have reviewed the patients History and Physical, chart, labs and discussed the procedure including the risks, benefits and alternatives for the proposed anesthesia with the patient or authorized representative who has indicated his/her understanding and acceptance.     Dental advisory given  Plan Discussed with: CRNA  Anesthesia Plan Comments:         Anesthesia Quick Evaluation

## 2023-05-25 ENCOUNTER — Encounter (HOSPITAL_COMMUNITY)
Admission: RE | Disposition: A | Discharge status: Home / Self Care | Payer: Self-pay | Source: Ambulatory Visit | Attending: Surgery

## 2023-05-25 ENCOUNTER — Encounter (HOSPITAL_COMMUNITY): Payer: Self-pay | Admitting: Surgery

## 2023-05-25 ENCOUNTER — Ambulatory Visit (HOSPITAL_BASED_OUTPATIENT_CLINIC_OR_DEPARTMENT_OTHER): Payer: MEDICAID | Admitting: Anesthesiology

## 2023-05-25 ENCOUNTER — Other Ambulatory Visit: Payer: Self-pay

## 2023-05-25 ENCOUNTER — Ambulatory Visit (HOSPITAL_COMMUNITY): Payer: MEDICAID | Admitting: Physician Assistant

## 2023-05-25 ENCOUNTER — Ambulatory Visit (HOSPITAL_COMMUNITY)
Admission: RE | Admit: 2023-05-25 | Discharge: 2023-05-25 | Disposition: A | Discharge status: Home / Self Care | Payer: MEDICAID | Source: Ambulatory Visit | Attending: Surgery | Admitting: Surgery

## 2023-05-25 DIAGNOSIS — K4020 Bilateral inguinal hernia, without obstruction or gangrene, not specified as recurrent: Secondary | ICD-10-CM

## 2023-05-25 DIAGNOSIS — E119 Type 2 diabetes mellitus without complications: Secondary | ICD-10-CM | POA: Insufficient documentation

## 2023-05-25 DIAGNOSIS — Z7985 Long-term (current) use of injectable non-insulin antidiabetic drugs: Secondary | ICD-10-CM | POA: Insufficient documentation

## 2023-05-25 DIAGNOSIS — Z7984 Long term (current) use of oral hypoglycemic drugs: Secondary | ICD-10-CM | POA: Diagnosis not present

## 2023-05-25 DIAGNOSIS — F319 Bipolar disorder, unspecified: Secondary | ICD-10-CM | POA: Diagnosis not present

## 2023-05-25 DIAGNOSIS — Z01818 Encounter for other preprocedural examination: Secondary | ICD-10-CM

## 2023-05-25 DIAGNOSIS — I10 Essential (primary) hypertension: Secondary | ICD-10-CM | POA: Insufficient documentation

## 2023-05-25 HISTORY — PX: INGUINAL HERNIA REPAIR: SHX194

## 2023-05-25 LAB — GLUCOSE, CAPILLARY
Glucose-Capillary: 133 mg/dL — ABNORMAL HIGH (ref 70–99)
Glucose-Capillary: 131 mg/dL — ABNORMAL HIGH (ref 70–99)
Glucose-Capillary: 235 mg/dL — ABNORMAL HIGH (ref 70–99)

## 2023-05-25 LAB — NO BLOOD PRODUCTS

## 2023-05-25 SURGERY — REPAIR, HERNIA, INGUINAL, BILATERAL, LAPAROSCOPIC
Anesthesia: General | Laterality: Bilateral

## 2023-05-25 MED ORDER — SUGAMMADEX SODIUM 200 MG/2ML IV SOLN
INTRAVENOUS | Status: DC | PRN
  Administered 2023-05-25: 200 mg via INTRAVENOUS

## 2023-05-25 MED ORDER — EPHEDRINE 5 MG/ML INJ
INTRAVENOUS | Status: AC
  Filled 2023-05-25: qty 5

## 2023-05-25 MED ORDER — DEXMEDETOMIDINE HCL IN NACL 80 MCG/20ML IV SOLN
INTRAVENOUS | Status: AC
  Filled 2023-05-25: qty 20

## 2023-05-25 MED ORDER — EPHEDRINE SULFATE-NACL 50-0.9 MG/10ML-% IV SOSY
PREFILLED_SYRINGE | INTRAVENOUS | Status: DC | PRN
  Administered 2023-05-25: 15 mg via INTRAVENOUS
  Administered 2023-05-25: 10 mg via INTRAVENOUS

## 2023-05-25 MED ORDER — PROPOFOL 10 MG/ML IV BOLUS
INTRAVENOUS | Status: DC | PRN
  Administered 2023-05-25 (×2): 30 mg via INTRAVENOUS
  Administered 2023-05-25: 170 mg via INTRAVENOUS
  Administered 2023-05-25 (×2): 50 mg via INTRAVENOUS

## 2023-05-25 MED ORDER — LACTATED RINGERS IV SOLN: INTRAVENOUS | Status: DC

## 2023-05-25 MED ORDER — 0.9 % SODIUM CHLORIDE (POUR BTL) OPTIME
TOPICAL | Status: DC | PRN
  Administered 2023-05-25: 1000 mL

## 2023-05-25 MED ORDER — BUPIVACAINE-EPINEPHRINE (PF) 0.25% -1:200000 IJ SOLN
INTRAMUSCULAR | Status: AC
  Filled 2023-05-25: qty 30

## 2023-05-25 MED ORDER — CHLORHEXIDINE GLUCONATE 0.12 % MT SOLN
15.0000 mL | Freq: Once | OROMUCOSAL | Status: AC
  Administered 2023-05-25: 15 mL via OROMUCOSAL

## 2023-05-25 MED ORDER — PROPOFOL 10 MG/ML IV BOLUS
INTRAVENOUS | Status: AC
  Filled 2023-05-25: qty 20

## 2023-05-25 MED ORDER — OXYCODONE HCL 5 MG PO TABS
ORAL_TABLET | ORAL | Status: AC
  Filled 2023-05-25: qty 1

## 2023-05-25 MED ORDER — LIDOCAINE HCL (PF) 2 % IJ SOLN
INTRAMUSCULAR | Status: AC
  Filled 2023-05-25: qty 5

## 2023-05-25 MED ORDER — CEFAZOLIN SODIUM-DEXTROSE 2-4 GM/100ML-% IV SOLN
2.0000 g | INTRAVENOUS | Status: AC
  Administered 2023-05-25: 2 g via INTRAVENOUS
  Filled 2023-05-25: qty 100

## 2023-05-25 MED ORDER — OXYCODONE HCL 5 MG/5ML PO SOLN: 5.0000 mg | Freq: Once | ORAL | Status: AC | PRN

## 2023-05-25 MED ORDER — ORAL CARE MOUTH RINSE: 15.0000 mL | Freq: Once | OROMUCOSAL | Status: AC

## 2023-05-25 MED ORDER — FENTANYL CITRATE PF 50 MCG/ML IJ SOSY
25.0000 ug | PREFILLED_SYRINGE | INTRAMUSCULAR | Status: DC | PRN
  Administered 2023-05-25 (×2): 50 ug via INTRAVENOUS

## 2023-05-25 MED ORDER — FENTANYL CITRATE (PF) 100 MCG/2ML IJ SOLN
INTRAMUSCULAR | Status: AC
  Filled 2023-05-25: qty 2

## 2023-05-25 MED ORDER — OXYCODONE HCL 5 MG PO TABS
5.0000 mg | ORAL_TABLET | Freq: Once | ORAL | Status: AC | PRN
  Administered 2023-05-25: 5 mg via ORAL

## 2023-05-25 MED ORDER — GABAPENTIN 300 MG PO CAPS
300.0000 mg | ORAL_CAPSULE | ORAL | Status: DC
  Filled 2023-05-25: qty 1

## 2023-05-25 MED ORDER — MIDAZOLAM HCL 5 MG/5ML IJ SOLN
INTRAMUSCULAR | Status: DC | PRN
  Administered 2023-05-25: 2 mg via INTRAVENOUS

## 2023-05-25 MED ORDER — KETOROLAC TROMETHAMINE 15 MG/ML IJ SOLN
15.0000 mg | INTRAMUSCULAR | Status: AC
  Administered 2023-05-25: 15 mg via INTRAVENOUS
  Filled 2023-05-25: qty 1

## 2023-05-25 MED ORDER — FENTANYL CITRATE PF 50 MCG/ML IJ SOSY
PREFILLED_SYRINGE | INTRAMUSCULAR | Status: AC
  Administered 2023-05-25: 50 ug via INTRAVENOUS
  Filled 2023-05-25: qty 2

## 2023-05-25 MED ORDER — CHLORHEXIDINE GLUCONATE CLOTH 2 % EX PADS: 6.0000 | MEDICATED_PAD | Freq: Once | CUTANEOUS | Status: DC

## 2023-05-25 MED ORDER — DEXAMETHASONE SODIUM PHOSPHATE 10 MG/ML IJ SOLN
INTRAMUSCULAR | Status: DC | PRN
  Administered 2023-05-25: 10 mg via INTRAVENOUS

## 2023-05-25 MED ORDER — DROPERIDOL 2.5 MG/ML IJ SOLN: 0.6250 mg | Freq: Once | INTRAMUSCULAR | Status: DC | PRN

## 2023-05-25 MED ORDER — OXYCODONE-ACETAMINOPHEN 5-325 MG PO TABS: 1.0000 | ORAL_TABLET | ORAL | 0 refills | Status: AC | PRN

## 2023-05-25 MED ORDER — DEXMEDETOMIDINE HCL IN NACL 80 MCG/20ML IV SOLN
INTRAVENOUS | Status: DC | PRN
  Administered 2023-05-25 (×2): 4 ug via INTRAVENOUS

## 2023-05-25 MED ORDER — BUPIVACAINE LIPOSOME 1.3 % IJ SUSP
INTRAMUSCULAR | Status: AC
  Filled 2023-05-25: qty 20

## 2023-05-25 MED ORDER — ONDANSETRON HCL 4 MG/2ML IJ SOLN
INTRAMUSCULAR | Status: AC
  Filled 2023-05-25: qty 2

## 2023-05-25 MED ORDER — GLYCOPYRROLATE PF 0.2 MG/ML IJ SOSY
PREFILLED_SYRINGE | INTRAMUSCULAR | Status: DC | PRN
  Administered 2023-05-25: .1 mg via INTRAVENOUS

## 2023-05-25 MED ORDER — MIDAZOLAM HCL 2 MG/2ML IJ SOLN
INTRAMUSCULAR | Status: AC
  Filled 2023-05-25: qty 2

## 2023-05-25 MED ORDER — FENTANYL CITRATE PF 50 MCG/ML IJ SOSY
PREFILLED_SYRINGE | INTRAMUSCULAR | Status: AC
  Filled 2023-05-25: qty 1

## 2023-05-25 MED ORDER — FENTANYL CITRATE (PF) 100 MCG/2ML IJ SOLN
INTRAMUSCULAR | Status: DC | PRN
  Administered 2023-05-25: 100 ug via INTRAVENOUS

## 2023-05-25 MED ORDER — ONDANSETRON HCL 4 MG/2ML IJ SOLN
INTRAMUSCULAR | Status: DC | PRN
  Administered 2023-05-25: 4 mg via INTRAVENOUS

## 2023-05-25 MED ORDER — ROCURONIUM BROMIDE 10 MG/ML (PF) SYRINGE
PREFILLED_SYRINGE | INTRAVENOUS | Status: DC | PRN
  Administered 2023-05-25: 60 mg via INTRAVENOUS
  Administered 2023-05-25 (×2): 10 mg via INTRAVENOUS

## 2023-05-25 MED ORDER — INSULIN ASPART 100 UNIT/ML IJ SOLN: 0.0000 [IU] | INTRAMUSCULAR | Status: DC | PRN

## 2023-05-25 MED ORDER — GLYCOPYRROLATE 0.2 MG/ML IJ SOLN
INTRAMUSCULAR | Status: AC
  Filled 2023-05-25: qty 1

## 2023-05-25 MED ORDER — DEXAMETHASONE SODIUM PHOSPHATE 10 MG/ML IJ SOLN
INTRAMUSCULAR | Status: AC
  Filled 2023-05-25: qty 1

## 2023-05-25 MED ORDER — ACETAMINOPHEN 500 MG PO TABS: 1000.0000 mg | ORAL_TABLET | Freq: Once | ORAL | Status: DC

## 2023-05-25 MED ORDER — SIMETHICONE 80 MG PO CHEW
80.0000 mg | CHEWABLE_TABLET | Freq: Four times a day (QID) | ORAL | Status: DC | PRN
  Administered 2023-05-25: 80 mg via ORAL
  Filled 2023-05-25 (×2): qty 1

## 2023-05-25 MED ORDER — LIDOCAINE HCL (PF) 2 % IJ SOLN
INTRAMUSCULAR | Status: DC | PRN
  Administered 2023-05-25: 100 mg via INTRADERMAL

## 2023-05-25 MED ORDER — BUPIVACAINE-EPINEPHRINE (PF) 0.25% -1:200000 IJ SOLN
INTRAMUSCULAR | Status: DC | PRN
  Administered 2023-05-25: 30 mL via PERINEURAL

## 2023-05-25 MED ORDER — ROCURONIUM BROMIDE 10 MG/ML (PF) SYRINGE
PREFILLED_SYRINGE | INTRAVENOUS | Status: AC
  Filled 2023-05-25: qty 10

## 2023-05-25 MED ORDER — BUPIVACAINE LIPOSOME 1.3 % IJ SUSP
20.0000 mL | Freq: Once | INTRAMUSCULAR | Status: DC
Start: 2023-05-25 — End: 2023-05-25

## 2023-05-25 MED ORDER — ACETAMINOPHEN 500 MG PO TABS
1000.0000 mg | ORAL_TABLET | ORAL | Status: AC
  Administered 2023-05-25: 1000 mg via ORAL
  Filled 2023-05-25: qty 2

## 2023-05-25 SURGICAL SUPPLY — 34 items
BAG COUNTER SPONGE SURGICOUNT (BAG) ×2 IMPLANT
CABLE HIGH FREQUENCY MONO STRZ (ELECTRODE) ×2 IMPLANT
CHLORAPREP W/TINT 26 (MISCELLANEOUS) ×2 IMPLANT
COVER SURGICAL LIGHT HANDLE (MISCELLANEOUS) ×2 IMPLANT
DERMABOND ADVANCED .7 DNX12 (GAUZE/BANDAGES/DRESSINGS) ×2 IMPLANT
ELECT REM PT RETURN 15FT ADLT (MISCELLANEOUS) ×2 IMPLANT
GLOVE BIO SURGEON STRL SZ7.5 (GLOVE) ×2 IMPLANT
GLOVE INDICATOR 8.0 STRL GRN (GLOVE) ×2 IMPLANT
GOWN STRL REUS W/ TWL XL LVL3 (GOWN DISPOSABLE) ×2 IMPLANT
GRASPER SUT TROCAR 14GX15 (MISCELLANEOUS) ×2 IMPLANT
IRRIGATION SUCT STRKRFLW 2 WTP (MISCELLANEOUS) IMPLANT
KIT BASIN OR (CUSTOM PROCEDURE TRAY) ×2 IMPLANT
KIT TURNOVER KIT A (KITS) IMPLANT
MARKER SKIN DUAL TIP RULER LAB (MISCELLANEOUS) ×2 IMPLANT
MESH 3DMAX 5X7 LT XLRG (Mesh General) ×1 IMPLANT
MESH 3DMAX 5X7 RT XLRG (Mesh General) ×1 IMPLANT
NDL INSUFFLATION 14GA 120MM (NEEDLE) ×1 IMPLANT
NEEDLE INSUFFLATION 14GA 120MM (NEEDLE) ×2 IMPLANT
RELOAD STAPLE 4.0 BLU F/HERNIA (INSTRUMENTS) ×1 IMPLANT
RELOAD STAPLE 4.8 BLK F/HERNIA (STAPLE) IMPLANT
RELOAD STAPLE HERNIA 4.0 BLUE (INSTRUMENTS) ×2 IMPLANT
RELOAD STAPLE HERNIA 4.8 BLK (STAPLE) ×2 IMPLANT
SCISSORS LAP 5X35 DISP (ENDOMECHANICALS) ×2 IMPLANT
SET TUBE SMOKE EVAC HIGH FLOW (TUBING) ×2 IMPLANT
SLEEVE Z-THREAD 5X100MM (TROCAR) ×2 IMPLANT
SPIKE FLUID TRANSFER (MISCELLANEOUS) IMPLANT
STAPLER HERNIA 12 8.5 360D (INSTRUMENTS) ×2 IMPLANT
SUT MNCRL AB 4-0 PS2 18 (SUTURE) ×2 IMPLANT
SUT VICRYL 0 UR6 27IN ABS (SUTURE) IMPLANT
TOWEL OR 17X26 10 PK STRL BLUE (TOWEL DISPOSABLE) ×2 IMPLANT
TRAY FOL W/BAG SLVR 16FR STRL (SET/KITS/TRAYS/PACK) IMPLANT
TRAY LAPAROSCOPIC (CUSTOM PROCEDURE TRAY) ×2 IMPLANT
TROCAR ADV FIXATION 12X100MM (TROCAR) ×2 IMPLANT
TROCAR Z-THREAD FIOS 5X100MM (TROCAR) ×2 IMPLANT

## 2023-05-25 NOTE — H&P (Signed)
 Admitting Physician: Avon Boers Warren Lindahl  Service: General surgery  CC: right inguinal hernia  Subjective   HPI: Robert Hooper is an 58 y.o. male who is here for hernia repair  Past Medical History:  Diagnosis Date   Arthritis    Back pain    chronic   Bipolar 1 disorder (HCC)    Diabetes mellitus without complication (HCC)    Hemorrhoids    Hypertension    Pemphigus vulgaris     History reviewed. No pertinent surgical history.  History reviewed. No pertinent family history.  Social:  reports that he has never smoked. He has never been exposed to tobacco smoke. He has never used smokeless tobacco. He reports that he does not drink alcohol and does not use drugs.  Allergies:  Allergies  Allergen Reactions   Lactose Intolerance (Gi) Nausea And Vomiting   Penicillins Other (See Comments)    Unknown childhood reaction   Iodinated Contrast Media Itching    Medications: Current Outpatient Medications  Medication Instructions   ALPRAZolam (XANAX) 1 mg, 2 times daily PRN   amphetamine-dextroamphetamine (ADDERALL) 30 MG tablet 30 mg, 2 times daily PRN   calcium-vitamin D  (OSCAL WITH D) 500-5 MG-MCG tablet 1 tablet, Daily   cyclobenzaprine  (FLEXERIL ) 10 mg, Oral, 2 times daily PRN   dapagliflozin propanediol (FARXIGA) 10 mg, Every morning   Dulaglutide 4.5 mg, Every Fri   glimepiride (AMARYL) 2 mg, Oral, Daily with breakfast   hydrocortisone  2.5 % cream 1 application , Every 6 hours PRN   lisinopril (ZESTRIL) 10 mg, Every morning   loratadine (CLARITIN REDITABS) 10 mg, Daily PRN   metFORMIN (GLUCOPHAGE) 1,000 mg, 2 times daily   Multiple Vitamins-Minerals (ADULT ONE DAILY GUMMIES) CHEW 2 tablets, Every morning   mycophenolate (CELLCEPT) 500 mg, 2 times daily   oxyCODONE  (ROXICODONE ) 15 mg, 5 times daily PRN   predniSONE (DELTASONE) 5 mg, Every morning   QUEtiapine (SEROQUEL) 400 mg, Daily at bedtime    ROS - all of the below systems have been reviewed with the  patient and positives are indicated with bold text General: chills, fever or night sweats Eyes: blurry vision or double vision ENT: epistaxis or sore throat Allergy/Immunology: itchy/watery eyes or nasal congestion Hematologic/Lymphatic: bleeding problems, blood clots or swollen lymph nodes Endocrine: temperature intolerance or unexpected weight changes Breast: new or changing breast lumps or nipple discharge Resp: cough, shortness of breath, or wheezing CV: chest pain or dyspnea on exertion GI: as per HPI GU: dysuria, trouble voiding, or hematuria MSK: joint pain or joint stiffness Neuro: TIA or stroke symptoms Derm: pruritus and skin lesion changes Psych: anxiety and depression  Objective   PE Blood pressure (!) 139/94, pulse 95, temperature 98.1 F (36.7 C), temperature source Oral, resp. rate 17, height 5\' 8"  (1.727 m), weight 90.7 kg, SpO2 98%. Constitutional: NAD; conversant; no deformities Eyes: Moist conjunctiva; no lid lag; anicteric; PERRL Neck: Trachea midline; no thyromegaly Lungs: Normal respiratory effort; no tactile fremitus CV: RRR; no palpable thrills; no pitting edema GI: Abd right inguinal hernia; no palpable hepatosplenomegaly MSK: Normal range of motion of extremities; no clubbing/cyanosis Psychiatric: Appropriate affect; alert and oriented x3 Lymphatic: No palpable cervical or axillary lymphadenopathy  Results for orders placed or performed during the hospital encounter of 05/25/23 (from the past 24 hours)  Glucose, capillary     Status: Abnormal   Collection Time: 05/25/23  5:32 AM  Result Value Ref Range   Glucose-Capillary 133 (H) 70 - 99 mg/dL  Comment 1 Notify RN   Glucose, capillary     Status: Abnormal   Collection Time: 05/25/23  7:31 AM  Result Value Ref Range   Glucose-Capillary 131 (H) 70 - 99 mg/dL   Comment 1 Notify RN     Imaging Orders  No imaging studies ordered today     Assessment and Plan   Robert Hooper is an 58 y.o. male  with a right inguinal hernia, here for laparoscopic repair.  We discussed the procedure, its risks, benefits and alternatives and the patient granted consent to proceed.    ICD-10-CM   1. Preop testing  Z01.818 CBG per protocol    CBG per protocol       Junie Olds, MD  [facility] Use [email or web address] to contact on call provider

## 2023-05-25 NOTE — Transfer of Care (Signed)
 Immediate Anesthesia Transfer of Care Note  Patient: Robert Hooper  Procedure(s) Performed: Procedure(s): LAPAROSCOPIC BILATERAL INGUINAL HERNIA REPAIR WITH MESH (Bilateral)  Patient Location: PACU  Anesthesia Type:General  Level of Consciousness:  sedated, patient cooperative and responds to stimulation  Airway & Oxygen Therapy:Patient Spontanous Breathing and Patient connected to face mask oxgen  Post-op Assessment:  Report given to PACU RN and Post -op Vital signs reviewed and stable  Post vital signs:  Reviewed and stable  Last Vitals:  Vitals:   05/25/23 0529 05/25/23 0918  BP: (!) 139/94 126/73  Pulse: 95   Resp: 17   Temp: 36.7 C 36.6 C  SpO2: 98% 100%    Complications: No apparent anesthesia complications

## 2023-05-25 NOTE — Discharge Instructions (Signed)

## 2023-05-25 NOTE — Anesthesia Procedure Notes (Addendum)
 Procedure Name: Intubation Date/Time: 05/25/2023 7:49 AM  Performed by: Vernadine Golas, MDPre-anesthesia Checklist: Patient identified, Emergency Drugs available, Suction available and Patient being monitored Patient Re-evaluated:Patient Re-evaluated prior to induction Oxygen Delivery Method: Circle System Utilized Preoxygenation: Pre-oxygenation with 100% oxygen Induction Type: IV induction Ventilation: Mask ventilation without difficulty Laryngoscope Size: Mac and 4 Tube type: Oral Tube size: 7.5 mm Number of attempts: 3 Airway Equipment and Method: Stylet and Oral airway Placement Confirmation: ETT inserted through vocal cords under direct vision, positive ETCO2 and breath sounds checked- equal and bilateral Secured at: 22 cm Tube secured with: Tape Dental Injury: Teeth and Oropharynx as per pre-operative assessment  Difficulty Due To: Difficult Airway- due to anterior larynx Future Recommendations: Recommend- induction with short-acting agent, and alternative techniques readily available Comments: First 2 attempts by paramedic student, first with Mac 3 and second with Mac 4, unable to see glottic structures. Next attempt by myself with Mac 4 blade, grade 2B view. Mask ventilated between attempts, SpO2 high 90s throughout, VSS. Jarrell Merritts, MD

## 2023-05-25 NOTE — Op Note (Signed)
 Patient: Robert Hooper (Sep 13, 1965, [id number])  Date of Surgery: 05/25/2023  Preoperative Diagnosis: Right inguinal hernia   Postoperative Diagnosis: Bilateral inguinal hernia   Surgical Procedure: LAPAROSCOPIC BILATERAL INGUINAL HERNIA REPAIR WITH MESH  Operative Team Members:  Surgeons and Role:    * Blessin Kanno, Avon Boers, MD - Primary   Anesthesiologist: Vernadine Golas, MD CRNA: Micky Albee, CRNA; Erleen Hawking, CRNA   Anesthesia: General   Fluids:  No intake/output data recorded.  Complications: None  Drains:  None  Specimen: None  Disposition:  PACU - hemodynamically stable.  Plan of Care: Discharge to home after PACU  Indications for Procedure: Robert Hooper is a 58 y.o. male who presented with a right inguinal hernia.  I recommended bilateral inguinal hernia repair with mesh.  We discussed the procedure itself as well as its risks, benefits and alternatives.  After a full discussion and all questions answered the patient granted consent to proceed.  Findings:  Technique: Transabdominal preperitoneal (TAPP) Hernia Location: Right direct and indirect inguinal hernia, small left inguinal hernia Mesh Size &Type:  Bard 3d Max Extra large right and left meshes Mesh Fixation: Endo-Universal hernia stapler  Infection status: Patient: Private Patient Elective Case Case: Elective Infection Present At Time Of Surgery (PATOS): None   Description of Procedure:  The patient was positioned supine, padded and secured to the bed, with both arms tucked.  The abdomen was widely prepped and draped.  A time out procedure was performed.  A 1 cm infraumbilical incision was made.  The abdomen was entered without trauma to the underlying viscera.  The abdomen was insufflated to 15 mm of Hg.  A 12 mm trocar was inserted at the periumbilical incision.  Additional 5 mm trocars were placed in the left and right abdomen.  There was no trauma to the underlying viscera.  There  was an indirect an direct hernia on the RIGHT.  Utilizing a transabdominal pre peritoneal technique (TAPP), a horizontal incision was made in the peritoneum, immediately below the umbilicus.  Dissection was carried out in the pre peritoneal space down to the level of the hernia sac which was reduced into the peritoneal cavity completely.  The cord contents were parietalized and preserved.  A large pre peritoneal dissection was performed to uncover the direct, indirect, femoral and obturator spaces.  Cooper's ligament was uncovered medially and the psoas muscle uncovered laterally.  The mesh, as documented above, was opened and advanced into the pre peritoneal position so that it more than adequately covered the indirect, direct, femoral and obturator spaces.  The mesh laid flat, with no inferior folds and covered the entire myopectineal orifice.  The mesh was fixated with the endo-universal hernia stapler to Cooper's ligament and the posterior aspect of the rectus muscle.  The peritoneal flap was closed with the same device.  There were no peritoneal defects or exposed mesh at the conclusion.  There was a small indirect hernia sac on the LEFT, so I decided to fix it to prevent it from becoming symptomatic in the future.  Utilizing a transabdominal pre peritoneal technique (TAPP), a horizontal incision was made in the peritoneum, immediately below the umbilicus.  Dissection was carried out in the pre peritoneal space down to the level of the hernia sac which was reduced into the peritoneal cavity completely.  The cord contents were parietalized and preserved.  A large pre peritoneal dissection was performed to uncover the direct, indirect, femoral and obturator spaces.  Cooper's ligament was uncovered medially  and the psoas muscle uncovered laterally.  The mesh, as documented above, was opened and advanced into the pre peritoneal position so that it more than adequately covered the indirect, direct, femoral and  obturator spaces.  The mesh laid flat, with no inferior folds and covered the entire myopectineal orifice.  The mesh was fixated with the endo-universal hernia stapler to Cooper's ligament and the posterior aspect of the rectus muscle.  The peritoneal flap was closed with the same device.  There were no peritoneal defects or exposed mesh at the conclusion.  The umbilical trocar was removed and the fascial defect was closed with a 0 Vicryl suture.  The peritoneal cavity was completely desufflated, the trocars removed and the skin closed with 4-0 Monocryl subcuticular suture and skin glue.  All sponge and needle counts were correct at the end of the case.  Teddie Favre, MD General, Bariatric, & Minimally Invasive Surgery [facility], [place]

## 2023-05-26 ENCOUNTER — Encounter (HOSPITAL_COMMUNITY): Payer: Self-pay | Admitting: Surgery

## 2023-05-27 NOTE — Anesthesia Postprocedure Evaluation (Signed)
 Anesthesia Post Note  Patient: Robert Hooper  Procedure(s) Performed: LAPAROSCOPIC BILATERAL INGUINAL HERNIA REPAIR WITH MESH (Bilateral)     Patient location during evaluation: PACU Anesthesia Type: General Level of consciousness: awake and alert Pain management: pain level controlled Vital Signs Assessment: post-procedure vital signs reviewed and stable Respiratory status: spontaneous breathing, nonlabored ventilation and respiratory function stable Cardiovascular status: blood pressure returned to baseline Postop Assessment: no apparent nausea or vomiting Anesthetic complications: no  No notable events documented.  Last Vitals:  Vitals:   05/25/23 1100 05/25/23 1115  BP: 103/71 114/77  Pulse: 86 87  Resp: 18 20  Temp:  36.4 C  SpO2: 94% 94%    Last Pain:  Vitals:   05/25/23 1115  TempSrc:   PainSc: 0-No pain                 Rayfield Cairo

## 2023-10-26 NOTE — Progress Notes (Signed)
 [facility] - Clinic Note  11/09/2023   CHIEF COMPLAINT Patient presents for Retina Follow Up  HISTORY OF PRESENT ILLNESS: Robert Hooper is a 58 y.o. 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  In both eyes.  This started 3 years ago.  Severity is moderate.  Duration of 1 year.  I, the attending physician,  performed the HPI with the patient and updated documentation appropriately.        Comments   Pt denies any changes in vision. PT states his allergies have been really bad this year and is using Visine allergy relief PRN OU.  A1c=7.9, 05/07/23      Last edited by Valdemar Rogue, MD on 11/14/2023  1:36 AM.     Patient states VA is stable. Still on prednisone, down to 5mg  daily (1/2 tablet) over the past 6-7 months. Prior he was on 10mg .   Referring physician: [name], MD [address] [place],  [place] [address]  HISTORICAL INFORMATION:  Selected notes from the MEDICAL RECORD NUMBER Pt transferred care from Dr. Elner LEE:  Ocular Hx- PMH-   CURRENT MEDICATIONS: Current Outpatient Medications (Ophthalmic Drugs)  Medication Sig   dorzolamide-timolol (COSOPT) 2-0.5 % ophthalmic solution Place 1 drop into the left eye 2 (two) times daily.   No current facility-administered medications for this visit. (Ophthalmic Drugs)   Current Outpatient Medications (Other)  Medication Sig   ALPRAZolam (XANAX) 1 MG tablet Take 1 mg by mouth 2 (two) times daily as needed for anxiety.   amphetamine-dextroamphetamine (ADDERALL) 30 MG tablet Take 30 mg by mouth 2 (two) times daily as needed (attention/focus).   calcium-vitamin D  (OSCAL WITH D) 500-5 MG-MCG tablet Take 1 tablet by mouth daily.   cyclobenzaprine  (FLEXERIL ) 10 MG tablet Take 1 tablet (10 mg total) by mouth 2 (two) times daily as needed for muscle spasms.   dapagliflozin propanediol (FARXIGA) 10 MG TABS tablet  Take 10 mg by mouth every morning.   Dulaglutide 4.5 MG/0.5ML SOAJ Inject 4.5 mg into the skin every Friday.   glimepiride (AMARYL) 4 MG tablet Take 2 mg by mouth daily with breakfast.   hydrocortisone  2.5 % cream Apply 1 application  topically every 6 (six) hours as needed (itching).   lisinopril (PRINIVIL,ZESTRIL) 10 MG tablet Take 10 mg by mouth every morning.   loratadine (CLARITIN REDITABS) 10 MG dissolvable tablet Take 10 mg by mouth daily as needed for allergies.   metFORMIN (GLUCOPHAGE) 1000 MG tablet Take 1,000 mg by mouth 2 (two) times daily.   Multiple Vitamins-Minerals (ADULT ONE DAILY GUMMIES) CHEW Chew 2 tablets by mouth in the morning.   mycophenolate (CELLCEPT) 500 MG tablet Take 500 mg by mouth 2 (two) times daily.   oxyCODONE  (ROXICODONE ) 15 MG immediate release tablet Take 15 mg by mouth 5 (five) times daily as needed for pain.   oxyCODONE -acetaminophen  (PERCOCET) 5-325 MG tablet Take 1 tablet by mouth every 4 (four) hours as needed for severe pain (pain score 7-10).   predniSONE (DELTASONE) 10 MG tablet Take 5 mg by mouth every morning.   QUEtiapine (SEROQUEL) 400 MG tablet Take 400 mg by mouth at bedtime.   No current facility-administered medications for this visit. (Other)   REVIEW OF SYSTEMS: ROS   Positive for: Musculoskeletal, Endocrine, Eyes, Psychiatric Last edited by Elnor Avelina RAMAN, COT on 11/09/2023  1:42 PM.  ALLERGIES Allergies  Allergen Reactions   Lactose Intolerance (Gi) Nausea And Vomiting   Iodinated Contrast Media Itching   PAST MEDICAL HISTORY Past Medical History:  Diagnosis Date   Arthritis    Back pain    chronic   Bipolar 1 disorder (HCC)    Diabetes mellitus without complication (HCC)    Hemorrhoids    Hypertension    Pemphigus vulgaris (HCC)    Past Surgical History:  Procedure Laterality Date   INGUINAL HERNIA REPAIR Bilateral 05/25/2023   Procedure: LAPAROSCOPIC BILATERAL INGUINAL HERNIA REPAIR WITH MESH;  Surgeon:  Stechschulte, Deward PARAS, MD;  Location: [facility];  Service: General;  Laterality: Bilateral;   FAMILY HISTORY History reviewed. No pertinent family history. SOCIAL HISTORY Social History   Tobacco Use   Smoking status: Never    Passive exposure: Never   Smokeless tobacco: Never  Vaping Use   Vaping status: Never Used  Substance Use Topics   Alcohol use: No   Drug use: No       OPHTHALMIC EXAM:  Base Eye Exam     Visual Acuity (Snellen - Linear)       Right Left   Dist [place] 20/20 20/20 -2         Tonometry (Tonopen, 1:34 PM)       Right Left   Pressure 20 24         Pupils       Pupils Dark Light Shape React APD   Right PERRL 5 3 Round Brisk None   Left PERRL 5 3 Round Brisk None         Visual Fields       Left Right    Full Full         Extraocular Movement       Right Left    Full, Ortho Full, Ortho         Neuro/Psych     Oriented x3: Yes   Mood/Affect: Normal         Dilation     Both eyes: 1.0% Mydriacyl, 2.5% Phenylephrine @ 1:35 PM           Slit Lamp and Fundus Exam     External Exam       Right Left   External Normal Normal         Slit Lamp Exam       Right Left   Lids/Lashes Normal Normal   Conjunctiva/Sclera 1+ injection, Mild Melanosis 1+ injection, Mild Melanosis   Cornea Arcus, trace tear film debris Arcus   Anterior Chamber Deep and clear Deep and clear   Iris Round and dilated, No NVI Round and dilated, No NVI   Lens 2+ Nuclear sclerosis, 2+ Cortical cataract 2+ Nuclear sclerosis, 2+ Cortical cataract   Anterior Vitreous vitreous synerisis vitreous synerisis         Fundus Exam       Right Left   Posterior Vitreous syneresis syneresis   Disc Pink and sharp, + cupping Pink and sharp, cupping, mild PPP   C/D Ratio 0.7 0.75   Macula Flat, Good foveal reflex, No heme or edema Flat, Good foveal reflex, No heme or edema   Vessels Tortuous, Vascular attenuation Tortuous, Vascular attenuation    Periphery Attached, No heme Attached, No heme           IMAGING AND PROCEDURES  Imaging and Procedures for 11/09/2023  OCT, Retina - OU - Both Eyes  Right Eye Quality was good. Scan locations included subfoveal. Central Foveal Thickness: 242. Progression has been stable. Findings include normal foveal contour, no IRF, no SRF, vitreomacular adhesion .   Left Eye Quality was good. Scan locations included subfoveal. Central Foveal Thickness: 246. Progression has been stable. Findings include normal foveal contour, no IRF, no SRF, vitreomacular adhesion .   Notes *Images captured and stored on drive  Diagnosis / Impression:  NFP; no IRF/SRF OU No DME OU  Clinical management:  See below  Abbreviations: NFP - Normal foveal profile. CME - cystoid macular edema. PED - pigment epithelial detachment. IRF - intraretinal fluid. SRF - subretinal fluid. EZ - ellipsoid zone. ERM - epiretinal membrane. ORA - outer retinal atrophy. ORT - outer retinal tubulation. SRHM - subretinal hyper-reflective material. IRHM - intraretinal hyper-reflective material           ASSESSMENT/PLAN:   ICD-10-CM   1. Diabetes mellitus type 2 without retinopathy (HCC)  E11.9 OCT, Retina - OU - Both Eyes    2. Diabetes mellitus treated with injections of non-insulin  medication (HCC)  E11.9    Z79.85     3. Diabetes mellitus treated with oral medication (HCC)  E11.9    Z79.84     4. Essential hypertension  I10     5. Hypertensive retinopathy of both eyes  H35.033 OCT, Retina - OU - Both Eyes    6. 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   - Takes Metformin, Jardiance and Trulicity - The incidence, risk factors for progression, natural history and treatment  options for diabetic retinopathy  were discussed with patient.   - The need for close monitoring of blood glucose, blood pressure, and serum lipids, avoiding cigarette or any type of tobacco, and the need for long term follow up was also discussed with patient. - OCT shows no DME OU - f/u in 1 year, sooner prn, DFE, OCT  4,5. Hypertensive retinopathy OU - discussed importance of tight BP control - monitor   6. Mixed Cataract OU - The symptoms of cataract, surgical options, and treatments and risks were discussed with patient. - discussed diagnosis and progression - monitor   7. Ocular Hypertension  - IOP 20,24  - Begin cosopt BID OU  - monitor  - f/u in 2 mos for IOP check  Ophthalmic Meds Ordered this visit:  Meds ordered this encounter  Medications   dorzolamide-timolol (COSOPT) 2-0.5 % ophthalmic solution    Sig: Place 1 drop into the left eye 2 (two) times daily.    Dispense:  3 mL    Refill:  11     Return in about 2 months (around 01/09/2024) for IOP check, DFE, OCT.  There are no Patient Instructions on file for this visit.  Explained the diagnoses, plan, and follow up with the patient and they expressed understanding.  Patient expressed understanding of the importance of proper follow up care.   This document serves as a record of services personally performed by Redell JUDITHANN Hans, MD, PhD. It was created on their behalf by Wanda GEANNIE Keens, COT an ophthalmic technician. The creation of this record is the provider's dictation and/or activities during the visit.    Electronically signed by:  Wanda GEANNIE Keens, COT  11/14/23 1:38 AM  This document serves as a record of services personally performed by Redell JUDITHANN Hans, MD, PhD. It was created on their behalf by Almetta Pesa, an ophthalmic technician. The creation of this record is the provider's dictation and/or activities during the visit.    Electronically signed by: Almetta Pesa, OA, 11/14/23  1:38  AM   Redell JUDITHANN Hans, M.D., Ph.D. Diseases & Surgery of the Retina and Vitreous [facility] 11/09/2023  I have reviewed the above documentation for accuracy and completeness, and I agree with the above. Redell JUDITHANN Hans, M.D., Ph.D. 11/14/23 1:39 AM    Abbreviations: M myopia (nearsighted); A astigmatism; H hyperopia (farsighted); P presbyopia; Mrx spectacle prescription;  CTL contact lenses; OD right eye; OS left eye; OU both eyes  XT exotropia; ET esotropia; PEK punctate epithelial keratitis; PEE punctate epithelial erosions; DES dry eye syndrome; MGD meibomian gland dysfunction; ATs artificial tears; PFAT's preservative free artificial tears; NSC nuclear sclerotic cataract; PSC posterior subcapsular cataract; ERM epi-retinal membrane; PVD posterior vitreous detachment; RD retinal detachment; DM diabetes mellitus; DR diabetic retinopathy; NPDR non-proliferative diabetic retinopathy; PDR proliferative diabetic retinopathy; CSME clinically significant macular edema; DME diabetic macular edema; dbh dot blot hemorrhages; CWS cotton wool spot; POAG primary open angle glaucoma; C/D cup-to-disc ratio; HVF humphrey visual field; GVF goldmann visual field; OCT optical coherence tomography; IOP intraocular pressure; BRVO Branch retinal vein occlusion; CRVO central retinal vein occlusion; CRAO central retinal artery occlusion; BRAO branch retinal artery occlusion; RT retinal tear; SB scleral buckle; PPV pars plana vitrectomy; VH Vitreous hemorrhage; PRP panretinal laser photocoagulation; IVK intravitreal kenalog; VMT vitreomacular traction; MH Macular hole;  NVD neovascularization of the disc; NVE neovascularization elsewhere; AREDS age related eye disease study; ARMD age related macular degeneration; POAG primary open angle glaucoma; EBMD epithelial/anterior basement membrane dystrophy; ACIOL anterior chamber intraocular lens; IOL intraocular lens; PCIOL posterior chamber intraocular lens;  Phaco/IOL phacoemulsification with intraocular lens placement; PRK photorefractive keratectomy; LASIK laser assisted in situ keratomileusis; HTN hypertension; DM diabetes mellitus; COPD chronic obstructive pulmonary disease

## 2023-11-09 ENCOUNTER — Encounter (INDEPENDENT_AMBULATORY_CARE_PROVIDER_SITE_OTHER): Payer: Self-pay | Admitting: Ophthalmology

## 2023-11-09 ENCOUNTER — Ambulatory Visit (INDEPENDENT_AMBULATORY_CARE_PROVIDER_SITE_OTHER): Payer: MEDICAID | Admitting: Ophthalmology

## 2023-11-09 DIAGNOSIS — H35033 Hypertensive retinopathy, bilateral: Secondary | ICD-10-CM

## 2023-11-09 DIAGNOSIS — Z7985 Long-term (current) use of injectable non-insulin antidiabetic drugs: Secondary | ICD-10-CM

## 2023-11-09 DIAGNOSIS — I10 Essential (primary) hypertension: Secondary | ICD-10-CM | POA: Diagnosis not present

## 2023-11-09 DIAGNOSIS — E119 Type 2 diabetes mellitus without complications: Secondary | ICD-10-CM

## 2023-11-09 DIAGNOSIS — H40053 Ocular hypertension, bilateral: Secondary | ICD-10-CM

## 2023-11-09 DIAGNOSIS — H25813 Combined forms of age-related cataract, bilateral: Secondary | ICD-10-CM

## 2023-11-09 DIAGNOSIS — Z7984 Long term (current) use of oral hypoglycemic drugs: Secondary | ICD-10-CM

## 2023-11-09 MED ORDER — DORZOLAMIDE HCL-TIMOLOL MAL 2-0.5 % OP SOLN: 1.0000 [drp] | Freq: Two times a day (BID) | OPHTHALMIC | 11 refills | Status: AC

## 2023-11-14 ENCOUNTER — Encounter (INDEPENDENT_AMBULATORY_CARE_PROVIDER_SITE_OTHER): Payer: Self-pay | Admitting: Ophthalmology

## 2023-12-28 NOTE — Progress Notes (Signed)
 [facility] - Clinic Note  01/11/2024   CHIEF COMPLAINT Patient presents for Retina Follow Up  HISTORY OF PRESENT ILLNESS: Robert Hooper is a 58 y.o. 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  In both eyes.  This started 3 years ago.  Severity is moderate.  Duration of 9 weeks.  Since onset it is stable.  I, the attending physician,  performed the HPI with the patient and updated documentation appropriately.        Comments   Pt denies any changes with vision. Pt does have issues with glare when driving at night and lately his eyes have been watering excessively outside. Pt is consistent with Cosopt  BID OU. Pt does not use any ATS. A1c=7.3, 2 months ago. BS=120 fasting, this am.       Last edited by Elnor Avelina RAMAN, COT on 01/11/2024  9:36 AM.      Patient states   Referring physician: [name], MD [address] [place],  [place] [address]  HISTORICAL INFORMATION:  Selected notes from the MEDICAL RECORD NUMBER Pt transferred care from Dr. Elner LEE:  Ocular Hx- PMH-   CURRENT MEDICATIONS: Current Outpatient Medications (Ophthalmic Drugs)  Medication Sig   dorzolamide -timolol  (COSOPT ) 2-0.5 % ophthalmic solution Place 1 drop into the left eye 2 (two) times daily.   No current facility-administered medications for this visit. (Ophthalmic Drugs)   Current Outpatient Medications (Other)  Medication Sig   ALPRAZolam (XANAX) 1 MG tablet Take 1 mg by mouth 2 (two) times daily as needed for anxiety.   amphetamine-dextroamphetamine (ADDERALL) 30 MG tablet Take 30 mg by mouth 2 (two) times daily as needed (attention/focus).   calcium-vitamin D  (OSCAL WITH D) 500-5 MG-MCG tablet Take 1 tablet by mouth daily.   cyclobenzaprine  (FLEXERIL ) 10 MG tablet Take 1 tablet (10 mg total) by mouth 2 (two) times daily as needed for muscle spasms.    dapagliflozin propanediol (FARXIGA) 10 MG TABS tablet Take 10 mg by mouth every morning.   Dulaglutide 4.5 MG/0.5ML SOAJ Inject 4.5 mg into the skin every Friday.   glimepiride (AMARYL) 4 MG tablet Take 2 mg by mouth daily with breakfast.   hydrocortisone  2.5 % cream Apply 1 application  topically every 6 (six) hours as needed (itching).   lisinopril (PRINIVIL,ZESTRIL) 10 MG tablet Take 10 mg by mouth every morning.   loratadine (CLARITIN REDITABS) 10 MG dissolvable tablet Take 10 mg by mouth daily as needed for allergies.   metFORMIN (GLUCOPHAGE) 1000 MG tablet Take 1,000 mg by mouth 2 (two) times daily.   Multiple Vitamins-Minerals (ADULT ONE DAILY GUMMIES) CHEW Chew 2 tablets by mouth in the morning.   mycophenolate (CELLCEPT) 500 MG tablet Take 500 mg by mouth 2 (two) times daily.   oxyCODONE  (ROXICODONE ) 15 MG immediate release tablet Take 15 mg by mouth 5 (five) times daily as needed for pain.   oxyCODONE -acetaminophen  (PERCOCET) 5-325 MG tablet Take 1 tablet by mouth every 4 (four) hours as needed for severe pain (pain score 7-10).   predniSONE (DELTASONE) 10 MG tablet Take 5 mg by mouth every morning.   QUEtiapine (SEROQUEL) 400 MG tablet Take 400 mg by mouth at bedtime.   No current facility-administered medications for this visit. (Other)   REVIEW OF SYSTEMS: ROS   Positive for: Musculoskeletal, Endocrine, Eyes, Psychiatric Last edited by Elnor Avelina RAMAN, COT  on 01/11/2024  9:23 AM.       ALLERGIES Allergies  Allergen Reactions   Lactose Intolerance (Gi) Nausea And Vomiting   Iodinated Contrast Media Itching   PAST MEDICAL HISTORY Past Medical History:  Diagnosis Date   Arthritis    Back pain    chronic   Bipolar 1 disorder (HCC)    Diabetes mellitus without complication (HCC)    Hemorrhoids    Hypertension    Pemphigus vulgaris (HCC)    Past Surgical History:  Procedure Laterality Date   INGUINAL HERNIA REPAIR Bilateral 05/25/2023   Procedure: LAPAROSCOPIC  BILATERAL INGUINAL HERNIA REPAIR WITH MESH;  Surgeon: Stechschulte, Deward PARAS, MD;  Location: [facility];  Service: General;  Laterality: Bilateral;   FAMILY HISTORY History reviewed. No pertinent family history. SOCIAL HISTORY Social History   Tobacco Use   Smoking status: Never    Passive exposure: Never   Smokeless tobacco: Never  Vaping Use   Vaping status: Never Used  Substance Use Topics   Alcohol use: No   Drug use: No       OPHTHALMIC EXAM:  Base Eye Exam     Visual Acuity (Snellen - Linear)       Right Left   Dist [place] 20/25 +2 20/20 -1   Dist ph [place] 20/20 -1 NI         Tonometry (Tonopen, 9:29 AM)       Right Left   Pressure 20 18         Pupils       Pupils Dark Light Shape React APD   Right PERRL 5 3 Round Brisk None   Left PERRL 5 3 Round Brisk None         Visual Fields       Left Right    Full Full         Extraocular Movement       Right Left    Full, Ortho Full, Ortho         Neuro/Psych     Oriented x3: Yes   Mood/Affect: Normal         Dilation     Both eyes: 1.0% Mydriacyl, 2.5% Phenylephrine @ 9:29 AM           Slit Lamp and Fundus Exam     External Exam       Right Left   External Normal Normal         Slit Lamp Exam       Right Left   Lids/Lashes Normal Normal   Conjunctiva/Sclera 1+ injection, Mild Melanosis 1+ injection, Mild Melanosis   Cornea Arcus, trace tear film debris Arcus   Anterior Chamber Deep and clear Deep and clear   Iris Round and dilated, No NVI Round and dilated, No NVI   Lens 2+ Nuclear sclerosis, 2+ Cortical cataract 2+ Nuclear sclerosis, 2+ Cortical cataract   Anterior Vitreous vitreous synerisis vitreous synerisis         Fundus Exam       Right Left   Posterior Vitreous syneresis syneresis   Disc Pink and sharp, + cupping Pink and sharp, cupping, mild PPP   C/D Ratio 0.7 0.75   Macula Flat, Good foveal reflex, No heme or edema Flat, Good foveal reflex, No heme or edema    Vessels Tortuous, Vascular attenuation Tortuous, Vascular attenuation   Periphery Attached, No heme Attached, No heme           IMAGING AND PROCEDURES  Imaging and Procedures for 01/11/2024         ASSESSMENT/PLAN:   ICD-10-CM   1. Diabetes mellitus type 2 without retinopathy (HCC)  E11.9 OCT, Retina - OU - Both Eyes    2. Diabetes mellitus treated with injections of non-insulin  medication (HCC)  E11.9    Z79.85     3. Diabetes mellitus treated with oral medication (HCC)  E11.9    Z79.84     4. Essential hypertension  I10     5. Hypertensive retinopathy of both eyes  H35.033     6. Combined forms of age-related cataract of both eyes  H25.813     7. 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 Diabetes mellitus, type 2 without retinopathy  - most recent A1c 7.9 (04.14.25), 7.5 (09.08.24) - taking oral Prednisone 10mg -down to 1/2 tablet at 5mg  daily.   - Takes Metformin, Jardiance and Trulicity - The incidence, risk factors for progression, natural history and treatment options for diabetic retinopathy  were discussed with patient.   - The need for close monitoring of blood glucose, blood pressure, and serum lipids, avoiding cigarette or any type of tobacco, and the need for long term follow up was also discussed with patient. -OCT shows no DME OU - f/u in 9 months, sooner prn, DFE, OCT  4,5. Hypertensive retinopathy OU - discussed importance of tight BP control - monitor   6. Mixed Cataract OU - The symptoms of cataract, surgical options, and treatments and risks were discussed with patient. - discussed diagnosis and progression - monitor   7. Ocular Hypertension  - IOP 20, 18  - continue Cosopt  BID OU  - monitor   Ophthalmic Meds Ordered this visit:  No orders of the defined types were placed in this encounter.    No follow-ups on file.  There are no Patient Instructions on file for this visit.  Explained the diagnoses, plan, and follow up with  the patient and they expressed understanding.  Patient expressed understanding of the importance of proper follow up care.   This document serves as a record of services personally performed by Redell JUDITHANN Hans, MD, PhD. It was created on their behalf by Wanda GEANNIE Keens, COT an ophthalmic technician. The creation of this record is the provider's dictation and/or activities during the visit.    Electronically signed by:  Wanda GEANNIE Keens, COT  01/11/24 10:14 AM  This document serves as a record of services personally performed by Redell JUDITHANN Hans, MD, PhD. It was created on their behalf by Almetta Pesa, an ophthalmic technician. The creation of this record is the provider's dictation and/or activities during the visit.    Electronically signed by: Almetta Pesa, OA, 01/11/24  10:14 AM  Redell JUDITHANN Hans, M.D., Ph.D. Diseases & Surgery of the Retina and Vitreous [facility] 01/11/2024     Abbreviations: M myopia (nearsighted); A astigmatism; H hyperopia (farsighted); P presbyopia; Mrx spectacle prescription;  CTL contact lenses; OD right eye; OS left eye; OU both eyes  XT exotropia; ET esotropia; PEK punctate epithelial keratitis; PEE punctate epithelial erosions; DES dry eye syndrome; MGD meibomian gland dysfunction; ATs artificial tears; PFAT's preservative free artificial tears; NSC nuclear sclerotic cataract; PSC posterior subcapsular cataract; ERM epi-retinal membrane; PVD posterior vitreous detachment; RD retinal detachment; DM diabetes mellitus; DR diabetic retinopathy; NPDR non-proliferative diabetic retinopathy; PDR proliferative diabetic retinopathy; CSME clinically significant macular edema; DME diabetic macular edema; dbh dot blot hemorrhages; CWS cotton  wool spot; POAG primary open angle glaucoma; C/D cup-to-disc ratio; HVF humphrey visual field; GVF goldmann visual field; OCT optical coherence tomography; IOP intraocular pressure; BRVO Branch retinal  vein occlusion; CRVO central retinal vein occlusion; CRAO central retinal artery occlusion; BRAO branch retinal artery occlusion; RT retinal tear; SB scleral buckle; PPV pars plana vitrectomy; VH Vitreous hemorrhage; PRP panretinal laser photocoagulation; IVK intravitreal kenalog; VMT vitreomacular traction; MH Macular hole;  NVD neovascularization of the disc; NVE neovascularization elsewhere; AREDS age related eye disease study; ARMD age related macular degeneration; POAG primary open angle glaucoma; EBMD epithelial/anterior basement membrane dystrophy; ACIOL anterior chamber intraocular lens; IOL intraocular lens; PCIOL posterior chamber intraocular lens; Phaco/IOL phacoemulsification with intraocular lens placement; PRK photorefractive keratectomy; LASIK laser assisted in situ keratomileusis; HTN hypertension; DM diabetes mellitus; COPD chronic obstructive pulmonary disease

## 2024-01-11 ENCOUNTER — Encounter (INDEPENDENT_AMBULATORY_CARE_PROVIDER_SITE_OTHER): Payer: Self-pay | Admitting: Ophthalmology

## 2024-01-11 ENCOUNTER — Ambulatory Visit (INDEPENDENT_AMBULATORY_CARE_PROVIDER_SITE_OTHER): Payer: MEDICAID | Admitting: Ophthalmology

## 2024-01-11 DIAGNOSIS — I10 Essential (primary) hypertension: Secondary | ICD-10-CM

## 2024-01-11 DIAGNOSIS — H25813 Combined forms of age-related cataract, bilateral: Secondary | ICD-10-CM | POA: Diagnosis not present

## 2024-01-11 DIAGNOSIS — E119 Type 2 diabetes mellitus without complications: Secondary | ICD-10-CM | POA: Diagnosis not present

## 2024-01-11 DIAGNOSIS — H40053 Ocular hypertension, bilateral: Secondary | ICD-10-CM | POA: Diagnosis not present

## 2024-01-11 DIAGNOSIS — H35033 Hypertensive retinopathy, bilateral: Secondary | ICD-10-CM | POA: Diagnosis not present

## 2024-01-11 DIAGNOSIS — Z7984 Long term (current) use of oral hypoglycemic drugs: Secondary | ICD-10-CM | POA: Diagnosis not present

## 2024-01-11 DIAGNOSIS — Z7985 Long-term (current) use of injectable non-insulin antidiabetic drugs: Secondary | ICD-10-CM | POA: Diagnosis not present

## 2024-01-16 ENCOUNTER — Encounter (INDEPENDENT_AMBULATORY_CARE_PROVIDER_SITE_OTHER): Payer: Self-pay | Admitting: Ophthalmology

## 2024-10-10 ENCOUNTER — Encounter (INDEPENDENT_AMBULATORY_CARE_PROVIDER_SITE_OTHER): Payer: MEDICAID | Admitting: Ophthalmology
